# Patient Record
Sex: Male | Born: 1935 | Smoking: Never smoker
Health system: Southern US, Community
[De-identification: ages and names within clinical notes are randomized; demographics above are authoritative.]

## PROBLEM LIST (undated history)

## (undated) DIAGNOSIS — M1990 Unspecified osteoarthritis, unspecified site: Secondary | ICD-10-CM

## (undated) DIAGNOSIS — I714 Abdominal aortic aneurysm, without rupture, unspecified: Secondary | ICD-10-CM

## (undated) DIAGNOSIS — K8010 Calculus of gallbladder with chronic cholecystitis without obstruction: Secondary | ICD-10-CM

## (undated) DIAGNOSIS — E119 Type 2 diabetes mellitus without complications: Secondary | ICD-10-CM

## (undated) DIAGNOSIS — K219 Gastro-esophageal reflux disease without esophagitis: Secondary | ICD-10-CM

## (undated) DIAGNOSIS — E785 Hyperlipidemia, unspecified: Secondary | ICD-10-CM

## (undated) DIAGNOSIS — E079 Disorder of thyroid, unspecified: Secondary | ICD-10-CM

## (undated) DIAGNOSIS — H9110 Presbycusis, unspecified ear: Secondary | ICD-10-CM

## (undated) DIAGNOSIS — J449 Chronic obstructive pulmonary disease, unspecified: Secondary | ICD-10-CM

## (undated) HISTORY — DX: Presbycusis, unspecified ear: H91.10

## (undated) HISTORY — DX: Abdominal aortic aneurysm, without rupture, unspecified: I71.40

## (undated) HISTORY — DX: Calculus of gallbladder with chronic cholecystitis without obstruction: K80.10

## (undated) HISTORY — DX: Hyperlipidemia, unspecified: E78.5

## (undated) HISTORY — DX: Abdominal aortic aneurysm, without rupture: I71.4

## (undated) HISTORY — DX: Disorder of thyroid, unspecified: E07.9

## (undated) HISTORY — DX: Type 2 diabetes mellitus without complications: E11.9

## (undated) HISTORY — DX: Gastro-esophageal reflux disease without esophagitis: K21.9

## (undated) HISTORY — DX: Unspecified osteoarthritis, unspecified site: M19.90

---

## 2013-01-06 DIAGNOSIS — R5381 Other malaise: Secondary | ICD-10-CM | POA: Diagnosis not present

## 2013-06-03 DIAGNOSIS — R5381 Other malaise: Secondary | ICD-10-CM | POA: Diagnosis not present

## 2013-06-03 DIAGNOSIS — R5383 Other fatigue: Secondary | ICD-10-CM | POA: Diagnosis not present

## 2013-08-25 DIAGNOSIS — R5383 Other fatigue: Secondary | ICD-10-CM | POA: Diagnosis not present

## 2013-08-25 DIAGNOSIS — R5381 Other malaise: Secondary | ICD-10-CM | POA: Diagnosis not present

## 2013-10-14 DIAGNOSIS — G933 Postviral fatigue syndrome: Secondary | ICD-10-CM | POA: Diagnosis not present

## 2013-10-16 DIAGNOSIS — Z23 Encounter for immunization: Secondary | ICD-10-CM | POA: Diagnosis not present

## 2013-11-13 DIAGNOSIS — E119 Type 2 diabetes mellitus without complications: Secondary | ICD-10-CM | POA: Diagnosis not present

## 2013-11-13 DIAGNOSIS — I10 Essential (primary) hypertension: Secondary | ICD-10-CM | POA: Diagnosis not present

## 2013-11-13 DIAGNOSIS — E785 Hyperlipidemia, unspecified: Secondary | ICD-10-CM | POA: Diagnosis not present

## 2013-11-13 DIAGNOSIS — R5382 Chronic fatigue, unspecified: Secondary | ICD-10-CM | POA: Diagnosis not present

## 2014-01-22 DIAGNOSIS — L570 Actinic keratosis: Secondary | ICD-10-CM | POA: Diagnosis not present

## 2014-01-22 DIAGNOSIS — L821 Other seborrheic keratosis: Secondary | ICD-10-CM | POA: Diagnosis not present

## 2014-01-22 DIAGNOSIS — Z85828 Personal history of other malignant neoplasm of skin: Secondary | ICD-10-CM | POA: Diagnosis not present

## 2014-06-12 DIAGNOSIS — I714 Abdominal aortic aneurysm, without rupture: Secondary | ICD-10-CM | POA: Diagnosis not present

## 2014-06-12 DIAGNOSIS — R1010 Upper abdominal pain, unspecified: Secondary | ICD-10-CM | POA: Diagnosis not present

## 2014-06-12 DIAGNOSIS — R1084 Generalized abdominal pain: Secondary | ICD-10-CM | POA: Diagnosis not present

## 2014-06-12 DIAGNOSIS — R1900 Intra-abdominal and pelvic swelling, mass and lump, unspecified site: Secondary | ICD-10-CM | POA: Diagnosis not present

## 2014-06-12 DIAGNOSIS — R109 Unspecified abdominal pain: Secondary | ICD-10-CM | POA: Diagnosis not present

## 2014-06-12 DIAGNOSIS — K8020 Calculus of gallbladder without cholecystitis without obstruction: Secondary | ICD-10-CM | POA: Diagnosis not present

## 2014-06-15 DIAGNOSIS — E119 Type 2 diabetes mellitus without complications: Secondary | ICD-10-CM | POA: Diagnosis not present

## 2014-06-15 DIAGNOSIS — I10 Essential (primary) hypertension: Secondary | ICD-10-CM | POA: Diagnosis not present

## 2014-06-15 DIAGNOSIS — K810 Acute cholecystitis: Secondary | ICD-10-CM | POA: Diagnosis not present

## 2014-06-15 DIAGNOSIS — K859 Acute pancreatitis, unspecified: Secondary | ICD-10-CM | POA: Diagnosis not present

## 2014-06-16 DIAGNOSIS — K624 Stenosis of anus and rectum: Secondary | ICD-10-CM | POA: Diagnosis not present

## 2014-06-16 DIAGNOSIS — K8020 Calculus of gallbladder without cholecystitis without obstruction: Secondary | ICD-10-CM | POA: Diagnosis not present

## 2014-06-16 DIAGNOSIS — I719 Aortic aneurysm of unspecified site, without rupture: Secondary | ICD-10-CM | POA: Diagnosis not present

## 2014-06-30 DIAGNOSIS — I714 Abdominal aortic aneurysm, without rupture: Secondary | ICD-10-CM | POA: Diagnosis not present

## 2014-06-30 DIAGNOSIS — K8010 Calculus of gallbladder with chronic cholecystitis without obstruction: Secondary | ICD-10-CM | POA: Diagnosis not present

## 2014-06-30 DIAGNOSIS — R1011 Right upper quadrant pain: Secondary | ICD-10-CM | POA: Diagnosis not present

## 2014-07-01 ENCOUNTER — Encounter: Payer: Self-pay | Admitting: Vascular Surgery

## 2014-07-01 ENCOUNTER — Ambulatory Visit (INDEPENDENT_AMBULATORY_CARE_PROVIDER_SITE_OTHER): Payer: Medicare Other | Admitting: Vascular Surgery

## 2014-07-01 VITALS — BP 138/73 | HR 86 | Ht 66.0 in | Wt 128.0 lb

## 2014-07-01 DIAGNOSIS — I714 Abdominal aortic aneurysm, without rupture, unspecified: Secondary | ICD-10-CM

## 2014-07-01 DIAGNOSIS — Z01810 Encounter for preprocedural cardiovascular examination: Secondary | ICD-10-CM | POA: Diagnosis not present

## 2014-07-01 NOTE — Progress Notes (Signed)
[facility] [place]  Patient name: Christian Dominguez MRN: [id number] DOB: 01/20/1921 Sex: male  REASON FOR CONSULT: abdominal aortic aneurysm  HPI: Christian Dominguez is a 81 y.o. male who was referred with a 6.2 cm abdominal aortic aneurysm. He was referred by Dr. Obie Dredge.   I have reviewed the records that were sent with the patient. The patient underwent an ultrasound on 06/12/2014. This showed a 6.2 cm infrarenal abdominal aortic aneurysm. The patient was being evaluated with cholelithiasis. The patient had been having abdominal pain which prompted the ultrasound to look for gallstones. He is being considered for elective cholecystectomy.  He denies any significant back pain. His abdominal pain has resolved. He had developed some mid abdominal pain approximately 3 weeks ago which lasted briefly unresolved. He denies any family history of aneurysmal disease.   Past Medical History  Diagnosis Date  . Calculus of gallbladder with cholecystitis   . AAA (abdominal aortic aneurysm)   . Arthritis   . Diabetes mellitus without complication   . Esophageal reflux   . Hearing loss of aging   . Hyperlipidemia   . Thyroid disease    Family History  Problem Relation Age of Onset  . Heart disease Father    SOCIAL HISTORY: History  Substance Use Topics  . Smoking status: Never Smoker   . Smokeless tobacco: Not on file  . Alcohol Use: No   No Known Allergies Current Outpatient Prescriptions  Medication Sig Dispense Refill  . albuterol (PROVENTIL HFA;VENTOLIN HFA) 108 (90 BASE) MCG/ACT inhaler Inhale into the lungs every 6 (six) hours as needed for wheezing or shortness of breath.    Marland Kitchen amLODipine (NORVASC) 10 MG tablet Take 10 mg by mouth daily.    Marland Kitchen glipiZIDE (GLUCOTROL) 5 MG tablet Take 5 mg by mouth daily before breakfast.    . levothyroxine (LEVOTHROID) 50 MCG tablet Take 50 mcg by mouth daily before breakfast.    . losartan-hydrochlorothiazide (HYZAAR) 100-12.5 MG per  tablet Take 1 tablet by mouth daily.    . metFORMIN (GLUCOPHAGE) 1000 MG tablet Take 1,000 mg by mouth 2 (two) times daily with a meal.    . omeprazole (PRILOSEC) 40 MG capsule Take 40 mg by mouth daily.    . ondansetron (ZOFRAN) 4 MG tablet Take 4 mg by mouth every 8 (eight) hours as needed for nausea or vomiting.    . pravastatin (PRAVACHOL) 40 MG tablet Take 40 mg by mouth daily.     No current facility-administered medications for this visit.   REVIEW OF SYSTEMS: Arly.Keller ] denotes positive finding; [  ] denotes negative finding  CARDIOVASCULAR:  [ ]  chest pain   [ ]  chest pressure   [ ]  palpitations   [ ]  orthopnea   Arly.Keller ] dyspnea on exertion   [ ]  claudication   [ ]  rest pain   [ ]  DVT   [ ]  phlebitis PULMONARY:   [ ]  productive cough   [ ]  asthma   [ ]  wheezing NEUROLOGIC:   [ ]  weakness  [ ]  paresthesias  [ ]  aphasia  [ ]  amaurosis  [ ]  dizziness HEMATOLOGIC:   [ ]  bleeding problems   [ ]  clotting disorders MUSCULOSKELETAL:  [ ]  joint pain   [ ]  joint swelling [ ]  leg swelling GASTROINTESTINAL: [ ]   blood in stool  [ ]   hematemesis GENITOURINARY:  [ ]   dysuria  [ ]   hematuria PSYCHIATRIC:  [ ]  history of major depression  INTEGUMENTARY:   rashes   ulcers CONSTITUTIONAL:   fever    chills  PHYSICAL EXAM: Filed Vitals:   07/01/14 1054  BP: 138/73  Pulse: 86  Height:  (1.676 m)  Weight: 128 lb (58.06 kg)  SpO2: 93%   GENERAL: The patient is a well-nourished male, in no acute distress. The vital signs are documented above. CARDIOVASCULAR: There is a regular rate and rhythm. I do not detect carotid bruits. He has palpable femoral pulses, palpable popliteal pulses, and palpable dorsalis pedis pulses. He has right lower extremity swelling which is chronic. PULMONARY: There is good air exchange bilaterally without wheezing or rales. ABDOMEN: Soft and non-tender with normal pitched bowel sounds. His aneurysm is palpable and nontender. MUSCULOSKELETAL: There are no major  deformities or cyanosis. NEUROLOGIC: No focal weakness or paresthesias are detected. SKIN: There are no ulcers or rashes noted. PSYCHIATRIC: The patient has a normal affect.  DATA:  I have reviewed his ultrasound which shows that he has a 6.2 cm infrarenal abdominal aortic aneurysm.  MEDICAL ISSUES:  6.2 CM ABDOMINAL AORTIC ANEURYSM: Given the size of the aneurysm the risk of rupture is approximately 15% per year. I have recommended a CT angiogram to evaluate him for possible endovascular aneurysm repair. Given his age certainly this would be ideal if possible. In addition we will arrange for preoperative cardiac evaluation. Pending these results we can consider elective repair of his aneurysm. Of note I spoke with his daughter who had some concerns about surgery given his age and obviously once we have more information we can discuss this further. We've also discussed that if he develops sudden abdominal pain or back pain to be sure that he gets to the emergency room urgently and notified in that he has a history of an aneurysm. He is not a smoker. I have encouraged him to begin taking an aspirin daily. He is on a statin.   Waverly Ferrari [facility] [place] Beeper: [phone]

## 2014-07-08 ENCOUNTER — Encounter: Payer: Self-pay | Admitting: Family Medicine

## 2014-07-09 ENCOUNTER — Ambulatory Visit: Payer: Medicare Other | Admitting: Cardiology

## 2014-07-09 DIAGNOSIS — E119 Type 2 diabetes mellitus without complications: Secondary | ICD-10-CM | POA: Diagnosis not present

## 2014-07-09 DIAGNOSIS — I714 Abdominal aortic aneurysm, without rupture: Secondary | ICD-10-CM | POA: Diagnosis not present

## 2014-07-09 DIAGNOSIS — I10 Essential (primary) hypertension: Secondary | ICD-10-CM | POA: Diagnosis not present

## 2014-07-09 DIAGNOSIS — E785 Hyperlipidemia, unspecified: Secondary | ICD-10-CM | POA: Diagnosis not present

## 2014-07-13 ENCOUNTER — Encounter: Payer: Self-pay | Admitting: Vascular Surgery

## 2014-07-13 ENCOUNTER — Other Ambulatory Visit: Payer: Self-pay | Admitting: Vascular Surgery

## 2014-07-13 DIAGNOSIS — I714 Abdominal aortic aneurysm, without rupture: Secondary | ICD-10-CM | POA: Diagnosis not present

## 2014-07-13 DIAGNOSIS — Z01810 Encounter for preprocedural cardiovascular examination: Secondary | ICD-10-CM | POA: Diagnosis not present

## 2014-07-13 LAB — CREATININE, SERUM: Creat: 2.25 mg/dL — ABNORMAL HIGH (ref 0.50–1.35)

## 2014-07-13 LAB — BUN: BUN: 41 mg/dL — ABNORMAL HIGH (ref 6–23)

## 2014-07-14 DIAGNOSIS — I517 Cardiomegaly: Secondary | ICD-10-CM | POA: Diagnosis not present

## 2014-07-14 DIAGNOSIS — Z01810 Encounter for preprocedural cardiovascular examination: Secondary | ICD-10-CM | POA: Diagnosis not present

## 2014-07-14 DIAGNOSIS — I714 Abdominal aortic aneurysm, without rupture: Secondary | ICD-10-CM | POA: Diagnosis not present

## 2014-07-14 DIAGNOSIS — I083 Combined rheumatic disorders of mitral, aortic and tricuspid valves: Secondary | ICD-10-CM | POA: Diagnosis not present

## 2014-07-14 NOTE — Progress Notes (Unsigned)
Notified by [facility] of elevated Creat. Of 2.25, and BUN of 41.  Per CT Tech, the Radiologist is recommending to either schedule an unenhanced CT scan of abd. And pelvis, or to admit to hospital for IV Hydration, per protocol, and then recheck labs, and poss. Do the CTA abd/ pelvis if IV Hydration improves his CR and BUN.  Discussed with Dr. Edilia Bo.  Recommended to sched. Pt. For admission to [facility]. On 07/16/14 for IV Hydration, and then plan to recheck lab work and reorder the CTA abd/ pelvis if labs improved.  Discussed with granddaughter, Tomma Lightning.  She spoke with the pt. and his daughter, and is requesting to await the test results of the Echocardiogram, and NM Stress Test, to know if he is even cleared for surgery, before proceeding.

## 2014-07-15 ENCOUNTER — Encounter (HOSPITAL_COMMUNITY)
Admission: EM | Disposition: A | Discharge status: Home Health | Payer: Self-pay | Source: Home / Self Care | Attending: Vascular Surgery

## 2014-07-15 ENCOUNTER — Inpatient Hospital Stay: Admission: RE | Admit: 2014-07-15 | Payer: Medicare Other | Source: Ambulatory Visit

## 2014-07-15 ENCOUNTER — Ambulatory Visit: Payer: Medicare Other | Admitting: Vascular Surgery

## 2014-07-15 ENCOUNTER — Emergency Department (HOSPITAL_COMMUNITY): Payer: Medicare Other | Admitting: Anesthesiology

## 2014-07-15 ENCOUNTER — Inpatient Hospital Stay (HOSPITAL_COMMUNITY)
Admission: EM | Admit: 2014-07-15 | Discharge: 2014-07-23 | DRG: 268 | Disposition: A | Discharge status: Home Health | Payer: Medicare Other | Attending: Vascular Surgery | Admitting: Vascular Surgery

## 2014-07-15 ENCOUNTER — Encounter (HOSPITAL_COMMUNITY): Payer: Self-pay | Admitting: Emergency Medicine

## 2014-07-15 ENCOUNTER — Emergency Department (HOSPITAL_COMMUNITY): Payer: Medicare Other

## 2014-07-15 DIAGNOSIS — N179 Acute kidney failure, unspecified: Secondary | ICD-10-CM | POA: Diagnosis not present

## 2014-07-15 DIAGNOSIS — I714 Abdominal aortic aneurysm, without rupture, unspecified: Secondary | ICD-10-CM | POA: Diagnosis present

## 2014-07-15 DIAGNOSIS — M1990 Unspecified osteoarthritis, unspecified site: Secondary | ICD-10-CM | POA: Diagnosis not present

## 2014-07-15 DIAGNOSIS — J9601 Acute respiratory failure with hypoxia: Secondary | ICD-10-CM | POA: Diagnosis not present

## 2014-07-15 DIAGNOSIS — R0602 Shortness of breath: Secondary | ICD-10-CM

## 2014-07-15 DIAGNOSIS — K4090 Unilateral inguinal hernia, without obstruction or gangrene, not specified as recurrent: Secondary | ICD-10-CM | POA: Diagnosis present

## 2014-07-15 DIAGNOSIS — N189 Chronic kidney disease, unspecified: Secondary | ICD-10-CM | POA: Diagnosis present

## 2014-07-15 DIAGNOSIS — Z8679 Personal history of other diseases of the circulatory system: Secondary | ICD-10-CM | POA: Insufficient documentation

## 2014-07-15 DIAGNOSIS — J8410 Pulmonary fibrosis, unspecified: Secondary | ICD-10-CM | POA: Diagnosis present

## 2014-07-15 DIAGNOSIS — Z79899 Other long term (current) drug therapy: Secondary | ICD-10-CM

## 2014-07-15 DIAGNOSIS — K851 Biliary acute pancreatitis: Secondary | ICD-10-CM | POA: Diagnosis present

## 2014-07-15 DIAGNOSIS — K5730 Diverticulosis of large intestine without perforation or abscess without bleeding: Secondary | ICD-10-CM | POA: Diagnosis not present

## 2014-07-15 DIAGNOSIS — I5032 Chronic diastolic (congestive) heart failure: Secondary | ICD-10-CM | POA: Diagnosis not present

## 2014-07-15 DIAGNOSIS — E1122 Type 2 diabetes mellitus with diabetic chronic kidney disease: Secondary | ICD-10-CM | POA: Diagnosis present

## 2014-07-15 DIAGNOSIS — K8020 Calculus of gallbladder without cholecystitis without obstruction: Secondary | ICD-10-CM | POA: Diagnosis present

## 2014-07-15 DIAGNOSIS — I272 Other secondary pulmonary hypertension: Secondary | ICD-10-CM | POA: Diagnosis not present

## 2014-07-15 DIAGNOSIS — Z09 Encounter for follow-up examination after completed treatment for conditions other than malignant neoplasm: Secondary | ICD-10-CM

## 2014-07-15 DIAGNOSIS — I4719 Other supraventricular tachycardia: Secondary | ICD-10-CM | POA: Diagnosis present

## 2014-07-15 DIAGNOSIS — I480 Paroxysmal atrial fibrillation: Secondary | ICD-10-CM | POA: Diagnosis not present

## 2014-07-15 DIAGNOSIS — K219 Gastro-esophageal reflux disease without esophagitis: Secondary | ICD-10-CM | POA: Diagnosis present

## 2014-07-15 DIAGNOSIS — J849 Interstitial pulmonary disease, unspecified: Secondary | ICD-10-CM

## 2014-07-15 DIAGNOSIS — H9190 Unspecified hearing loss, unspecified ear: Secondary | ICD-10-CM | POA: Diagnosis present

## 2014-07-15 DIAGNOSIS — Z9889 Other specified postprocedural states: Secondary | ICD-10-CM | POA: Insufficient documentation

## 2014-07-15 DIAGNOSIS — Z8249 Family history of ischemic heart disease and other diseases of the circulatory system: Secondary | ICD-10-CM

## 2014-07-15 DIAGNOSIS — I471 Supraventricular tachycardia: Secondary | ICD-10-CM | POA: Diagnosis not present

## 2014-07-15 DIAGNOSIS — E785 Hyperlipidemia, unspecified: Secondary | ICD-10-CM | POA: Diagnosis present

## 2014-07-15 DIAGNOSIS — K5750 Diverticulosis of both small and large intestine without perforation or abscess without bleeding: Secondary | ICD-10-CM | POA: Diagnosis present

## 2014-07-15 DIAGNOSIS — Z452 Encounter for adjustment and management of vascular access device: Secondary | ICD-10-CM

## 2014-07-15 DIAGNOSIS — D62 Acute posthemorrhagic anemia: Secondary | ICD-10-CM | POA: Diagnosis not present

## 2014-07-15 DIAGNOSIS — K859 Acute pancreatitis, unspecified: Secondary | ICD-10-CM | POA: Diagnosis not present

## 2014-07-15 DIAGNOSIS — I959 Hypotension, unspecified: Secondary | ICD-10-CM | POA: Diagnosis present

## 2014-07-15 HISTORY — PX: ABDOMINAL AORTIC ENDOVASCULAR STENT GRAFT: SHX5707

## 2014-07-15 LAB — POCT I-STAT 7, (LYTES, BLD GAS, ICA,H+H)
Acid-base deficit: 4 mmol/L — ABNORMAL HIGH (ref 0.0–2.0)
Bicarbonate: 21.8 mEq/L (ref 20.0–24.0)
Calcium, Ion: 1.15 mmol/L (ref 1.13–1.30)
HEMATOCRIT: 31 % — AB (ref 39.0–52.0)
HEMOGLOBIN: 10.5 g/dL — AB (ref 13.0–17.0)
O2 Saturation: 99 %
PCO2 ART: 42.2 mmHg (ref 35.0–45.0)
PH ART: 7.317 — AB (ref 7.350–7.450)
Patient temperature: 36
Potassium: 3.9 mmol/L (ref 3.5–5.1)
SODIUM: 134 mmol/L — AB (ref 135–145)
TCO2: 23 mmol/L (ref 0–100)
pO2, Arterial: 122 mmHg — ABNORMAL HIGH (ref 80.0–100.0)

## 2014-07-15 LAB — COMPREHENSIVE METABOLIC PANEL
ALBUMIN: 3.8 g/dL (ref 3.5–5.0)
ALK PHOS: 95 U/L (ref 38–126)
ALT: 245 U/L — ABNORMAL HIGH (ref 17–63)
ANION GAP: 12 (ref 5–15)
AST: 555 U/L — ABNORMAL HIGH (ref 15–41)
BUN: 40 mg/dL — ABNORMAL HIGH (ref 6–20)
CALCIUM: 9.1 mg/dL (ref 8.9–10.3)
CO2: 24 mmol/L (ref 22–32)
Chloride: 96 mmol/L — ABNORMAL LOW (ref 101–111)
Creatinine, Ser: 3.17 mg/dL — ABNORMAL HIGH (ref 0.61–1.24)
GFR calc Af Amer: 18 mL/min — ABNORMAL LOW (ref >60)
GFR calc non Af Amer: 16 mL/min — ABNORMAL LOW (ref >60)
GLUCOSE: 120 mg/dL — AB (ref 65–99)
POTASSIUM: 4.5 mmol/L (ref 3.5–5.1)
SODIUM: 132 mmol/L — AB (ref 135–145)
TOTAL PROTEIN: 7.3 g/dL (ref 6.5–8.1)
Total Bilirubin: 1.9 mg/dL — ABNORMAL HIGH (ref 0.3–1.2)

## 2014-07-15 LAB — CBC
HEMATOCRIT: 36.5 % — AB (ref 39.0–52.0)
HEMOGLOBIN: 12.2 g/dL — AB (ref 13.0–17.0)
MCH: 30.2 pg (ref 26.0–34.0)
MCHC: 33.4 g/dL (ref 30.0–36.0)
MCV: 90.3 fL (ref 78.0–100.0)
PLATELETS: 322 10*3/uL (ref 150–400)
RBC: 4.04 MIL/uL — AB (ref 4.22–5.81)
RDW: 14.3 % (ref 11.5–15.5)
WBC: 24.8 10*3/uL — AB (ref 4.0–10.5)

## 2014-07-15 LAB — I-STAT CG4 LACTIC ACID, ED: Lactic Acid, Venous: 2.28 mmol/L (ref 0.5–2.0)

## 2014-07-15 LAB — ABO/RH: ABO/RH(D): A POS

## 2014-07-15 LAB — PREPARE RBC (CROSSMATCH)

## 2014-07-15 SURGERY — INSERTION, ENDOVASCULAR STENT GRAFT, AORTA, ABDOMINAL
Anesthesia: General | Site: Abdomen

## 2014-07-15 MED ORDER — VECURONIUM BROMIDE 10 MG IV SOLR
INTRAVENOUS | Status: AC
  Filled 2014-07-15: qty 10

## 2014-07-15 MED ORDER — LIDOCAINE HCL (CARDIAC) 20 MG/ML IV SOLN
INTRAVENOUS | Status: DC | PRN
  Administered 2014-07-15: 50 mg via INTRAVENOUS

## 2014-07-15 MED ORDER — FENTANYL CITRATE (PF) 250 MCG/5ML IJ SOLN
INTRAMUSCULAR | Status: AC
  Filled 2014-07-15: qty 5

## 2014-07-15 MED ORDER — HEPARIN SODIUM (PORCINE) 1000 UNIT/ML IJ SOLN
INTRAMUSCULAR | Status: AC
  Filled 2014-07-15: qty 1

## 2014-07-15 MED ORDER — CEFAZOLIN SODIUM-DEXTROSE 2-3 GM-% IV SOLR
INTRAVENOUS | Status: DC | PRN
Start: 2014-07-15 — End: 2014-07-16
  Administered 2014-07-15: 2 g via INTRAVENOUS

## 2014-07-15 MED ORDER — SODIUM CHLORIDE 0.9 % IV SOLN
1000.0000 mL | Freq: Once | INTRAVENOUS | Status: AC
  Administered 2014-07-15: 1000 mL via INTRAVENOUS

## 2014-07-15 MED ORDER — IODIXANOL 320 MG/ML IV SOLN
INTRAVENOUS | Status: DC | PRN
  Administered 2014-07-15: 55.6 mL via INTRAVENOUS

## 2014-07-15 MED ORDER — LACTATED RINGERS IV SOLN
INTRAVENOUS | Status: DC | PRN
  Administered 2014-07-15 (×2): via INTRAVENOUS

## 2014-07-15 MED ORDER — MIDAZOLAM HCL 2 MG/2ML IJ SOLN
INTRAMUSCULAR | Status: AC
  Filled 2014-07-15: qty 2

## 2014-07-15 MED ORDER — NEOSTIGMINE METHYLSULFATE 10 MG/10ML IV SOLN
INTRAVENOUS | Status: AC
  Filled 2014-07-15: qty 1

## 2014-07-15 MED ORDER — ETOMIDATE 2 MG/ML IV SOLN
INTRAVENOUS | Status: DC | PRN
  Administered 2014-07-15: 10 mg via INTRAVENOUS

## 2014-07-15 MED ORDER — SODIUM CHLORIDE 0.9 % IV SOLN: Freq: Once | INTRAVENOUS | Status: DC

## 2014-07-15 MED ORDER — PROTAMINE SULFATE 10 MG/ML IV SOLN
INTRAVENOUS | Status: DC | PRN
  Administered 2014-07-15: 30 mg via INTRAVENOUS
  Administered 2014-07-15: 20 mg via INTRAVENOUS

## 2014-07-15 MED ORDER — PROTAMINE SULFATE 10 MG/ML IV SOLN
INTRAVENOUS | Status: AC
  Filled 2014-07-15: qty 5

## 2014-07-15 MED ORDER — SUCCINYLCHOLINE CHLORIDE 20 MG/ML IJ SOLN
INTRAMUSCULAR | Status: AC
  Filled 2014-07-15: qty 1

## 2014-07-15 MED ORDER — CEFAZOLIN SODIUM-DEXTROSE 2-3 GM-% IV SOLR
INTRAVENOUS | Status: AC
  Filled 2014-07-15: qty 50

## 2014-07-15 MED ORDER — STERILE WATER FOR INJECTION IJ SOLN
INTRAMUSCULAR | Status: AC
  Filled 2014-07-15: qty 10

## 2014-07-15 MED ORDER — ROCURONIUM BROMIDE 50 MG/5ML IV SOLN
INTRAVENOUS | Status: AC
  Filled 2014-07-15: qty 1

## 2014-07-15 MED ORDER — HEPARIN SODIUM (PORCINE) 1000 UNIT/ML IJ SOLN
INTRAMUSCULAR | Status: DC | PRN
  Administered 2014-07-15: 6000 [IU] via INTRAVENOUS

## 2014-07-15 MED ORDER — EPHEDRINE SULFATE 50 MG/ML IJ SOLN
INTRAMUSCULAR | Status: DC | PRN
Start: 2014-07-15 — End: 2014-07-16
  Administered 2014-07-15 (×2): 5 mg via INTRAVENOUS
  Administered 2014-07-15: 10 mg via INTRAVENOUS
  Administered 2014-07-15: 5 mg via INTRAVENOUS

## 2014-07-15 MED ORDER — VECURONIUM BROMIDE 10 MG IV SOLR
INTRAVENOUS | Status: DC | PRN
  Administered 2014-07-15: 5 mg via INTRAVENOUS

## 2014-07-15 MED ORDER — SODIUM CHLORIDE 0.9 % IR SOLN
Status: DC | PRN
  Administered 2014-07-15: 500 mL

## 2014-07-15 MED ORDER — PROPOFOL 10 MG/ML IV BOLUS
INTRAVENOUS | Status: AC
  Filled 2014-07-15: qty 20

## 2014-07-15 MED ORDER — GLYCOPYRROLATE 0.2 MG/ML IJ SOLN
INTRAMUSCULAR | Status: AC
  Filled 2014-07-15: qty 2

## 2014-07-15 MED ORDER — ETOMIDATE 2 MG/ML IV SOLN
INTRAVENOUS | Status: AC
  Filled 2014-07-15: qty 10

## 2014-07-15 MED ORDER — PHENYLEPHRINE HCL 10 MG/ML IJ SOLN
INTRAMUSCULAR | Status: AC
  Filled 2014-07-15: qty 1

## 2014-07-15 MED ORDER — SODIUM CHLORIDE 0.9 % IV SOLN: 1000.0000 mL | INTRAVENOUS | Status: DC

## 2014-07-15 MED ORDER — ESMOLOL HCL 10 MG/ML IV SOLN
INTRAVENOUS | Status: AC
  Filled 2014-07-15: qty 10

## 2014-07-15 MED ORDER — FENTANYL CITRATE (PF) 100 MCG/2ML IJ SOLN
INTRAMUSCULAR | Status: DC | PRN
  Administered 2014-07-15 (×3): 50 ug via INTRAVENOUS
  Administered 2014-07-15: 100 ug via INTRAVENOUS

## 2014-07-15 MED ORDER — SUCCINYLCHOLINE CHLORIDE 20 MG/ML IJ SOLN
INTRAMUSCULAR | Status: DC | PRN
  Administered 2014-07-15: 80 mg via INTRAVENOUS

## 2014-07-15 MED ORDER — PHENYLEPHRINE HCL 10 MG/ML IJ SOLN
10.0000 mg | INTRAVENOUS | Status: DC | PRN
  Administered 2014-07-15: 25 ug/min via INTRAVENOUS

## 2014-07-15 MED ORDER — PHENYLEPHRINE HCL 10 MG/ML IJ SOLN
INTRAMUSCULAR | Status: DC | PRN
  Administered 2014-07-15: 80 ug via INTRAVENOUS
  Administered 2014-07-15: 40 ug via INTRAVENOUS
  Administered 2014-07-15 (×2): 80 ug via INTRAVENOUS
  Administered 2014-07-15: 120 ug via INTRAVENOUS

## 2014-07-15 MED ORDER — IOHEXOL 350 MG/ML SOLN
100.0000 mL | Freq: Once | INTRAVENOUS | Status: AC | PRN
  Administered 2014-07-15: 100 mL via INTRAVENOUS

## 2014-07-15 SURGICAL SUPPLY — 60 items
BAG SNAP BAND KOVER 36X36 (MISCELLANEOUS) ×2 IMPLANT
CANISTER SUCTION 2500CC (MISCELLANEOUS) ×2 IMPLANT
CATH ANGIO 5F BER2 100CM (CATHETERS) ×2 IMPLANT
CATH BEACON 5.038 65CM KMP-01 (CATHETERS) IMPLANT
CATH OMNI FLUSH .035X70CM (CATHETERS) ×2 IMPLANT
CATH STRAIGHT 5FR 65CM (CATHETERS) ×2 IMPLANT
COVER BACK TABLE 60X90IN (DRAPES) ×2 IMPLANT
COVER DOME SNAP 22 D (MISCELLANEOUS) ×2 IMPLANT
COVER MAYO STAND STRL (DRAPES) ×2 IMPLANT
COVER PROBE W GEL 5X96 (DRAPES) ×2 IMPLANT
DEVICE CLOSURE PERCLS PRGLD 6F (VASCULAR PRODUCTS) ×4 IMPLANT
DEVICE TORQUE KENDALL .025-038 (MISCELLANEOUS) ×2 IMPLANT
DRSG TEGADERM 2-3/8X2-3/4 SM (GAUZE/BANDAGES/DRESSINGS) ×2 IMPLANT
DRYSEAL FLEXSHEATH 12FR 33CM (SHEATH) ×1
DRYSEAL FLEXSHEATH 16FR 33CM (SHEATH) ×1
ELECT REM PT RETURN 9FT ADLT (ELECTROSURGICAL) ×4
ELECTRODE REM PT RTRN 9FT ADLT (ELECTROSURGICAL) ×2 IMPLANT
EXCLUDER TNK 23X14.5MMX12CM (Endovascular Graft) ×1 IMPLANT
EXCLUDER TRUNK 23X14.5MMX12CM (Endovascular Graft) ×2 IMPLANT
GAUZE SPONGE 2X2 8PLY STRL LF (GAUZE/BANDAGES/DRESSINGS) ×1 IMPLANT
GLOVE BIO SURGEON STRL SZ7.5 (GLOVE) ×2 IMPLANT
GOWN STRL REUS W/ TWL LRG LVL3 (GOWN DISPOSABLE) ×3 IMPLANT
GOWN STRL REUS W/TWL LRG LVL3 (GOWN DISPOSABLE) ×3
GRAFT BALLN CATH 65CM (STENTS) ×1 IMPLANT
GUIDEWIRE ANGLED .035X150CM (WIRE) ×2 IMPLANT
KIT BASIN OR (CUSTOM PROCEDURE TRAY) ×2 IMPLANT
KIT HEART LEFT (KITS) IMPLANT
KIT ROOM TURNOVER OR (KITS) ×2 IMPLANT
LEG CONTRALATERAL 16X16X13.5 (Endovascular Graft) ×1 IMPLANT
LEG CONTRALETERAL16X16X11.5 (Endovascular Graft) ×1 IMPLANT
LIQUID BAND (GAUZE/BANDAGES/DRESSINGS) ×2 IMPLANT
NEEDLE PERC 18GX7CM (NEEDLE) ×4 IMPLANT
NS IRRIG 1000ML POUR BTL (IV SOLUTION) ×2 IMPLANT
PACK ENDOVASCULAR (PACKS) ×2 IMPLANT
PAD ARMBOARD 7.5X6 YLW CONV (MISCELLANEOUS) ×4 IMPLANT
PERCLOSE PROGLIDE 6F (VASCULAR PRODUCTS) ×8
SHEATH AVANTI 11CM 8FR (MISCELLANEOUS) ×2 IMPLANT
SHEATH BRITE TIP 8FR 23CM (MISCELLANEOUS) ×2 IMPLANT
SHEATH DRYSEAL FLEX 12FR 33CM (SHEATH) ×1 IMPLANT
SHEATH DRYSEAL FLEX 16FR 33CM (SHEATH) ×1 IMPLANT
SPONGE GAUZE 2X2 STER 10/PKG (GAUZE/BANDAGES/DRESSINGS) ×1
SPONGE SURGIFOAM ABS GEL 100 (HEMOSTASIS) IMPLANT
STAPLER VISISTAT 35W (STAPLE) IMPLANT
STENT GRAFT BALLN CATH 65CM (STENTS) ×1
STENT GRAFT CONTRALAT 16X11.5 (Endovascular Graft) ×1 IMPLANT
STENT GRAFT CONTRALAT 16X13.5 (Endovascular Graft) ×1 IMPLANT
STOPCOCK MORSE 400PSI 3WAY (MISCELLANEOUS) ×4 IMPLANT
SUT PROLENE 5 0 C 1 24 (SUTURE) IMPLANT
SUT VIC AB 2-0 CT1 27 (SUTURE)
SUT VIC AB 2-0 CT1 TAPERPNT 27 (SUTURE) IMPLANT
SUT VIC AB 3-0 SH 27 (SUTURE)
SUT VIC AB 3-0 SH 27X BRD (SUTURE) IMPLANT
SUT VICRYL 4-0 PS2 18IN ABS (SUTURE) ×4 IMPLANT
SYR 30ML LL (SYRINGE) ×2 IMPLANT
SYRINGE 10CC LL (SYRINGE) ×4 IMPLANT
SYRINGE 20CC LL (MISCELLANEOUS) ×4 IMPLANT
TRAY FOLEY W/METER SILVER 16FR (SET/KITS/TRAYS/PACK) ×2 IMPLANT
TUBING HIGH PRESSURE 120CM (CONNECTOR) ×4 IMPLANT
WIRE AMPLATZ SS-J .035X180CM (WIRE) ×4 IMPLANT
WIRE BENTSON .035X145CM (WIRE) ×4 IMPLANT

## 2014-07-15 NOTE — ED Notes (Signed)
Pt transported to CT on monitor with surgeon and RN accompanying.  No issues while out of room.  Pt back from CT,  OR PA in room at this time.

## 2014-07-15 NOTE — H&P (Signed)
[facility] H&P  [id number]  History of Present Illness: This is a 81 y.o. male with known history of 6.2 cm  abdominal aortic aneurysm who presents to the emergency room with complaints of abdominal pain, nausea and vomiting since 1 pm. He is accompanied by his daughter and granddaughter. He had an episode of abdominal pain a few weeks ago that was found to be pancreatitis. He was seen recently by Dr. Edilia Bo on 07/01/14 for his AAA that was found incidentally on an abdominal ultrasound. Dr. Edilia Bo referred the patient for cardiac clearance for future elective repair. He was also recommended to have a CT angiogram for evaluation but did not complete this yet due to renal dysfunction. He did undergo cardiac evaluation recently and the family reports everything "was good." There are no accessible records of this. He does also have gallstones.  He has a past medical history of hyperlipidemia, cholecystitis and diabetes.   Past Medical History  Diagnosis Date  . Calculus of gallbladder with cholecystitis   . AAA (abdominal aortic aneurysm)   . Arthritis   . Diabetes mellitus without complication   . Esophageal reflux   . Hearing loss of aging   . Hyperlipidemia   . Thyroid disease    History reviewed. No pertinent past surgical history.  No Known Allergies  Prior to Admission medications   Medication Sig Start Date End Date Taking? Authorizing Provider  albuterol (PROVENTIL HFA;VENTOLIN HFA) 108 (90 BASE) MCG/ACT inhaler Inhale into the lungs every 6 (six) hours as needed for wheezing or shortness of breath.   Yes Historical Provider, MD  amLODipine (NORVASC) 10 MG tablet Take 10 mg by mouth at bedtime.    Yes Historical Provider, MD  levothyroxine (LEVOTHROID) 50 MCG tablet Take 50 mcg by mouth daily before breakfast.   Yes Historical Provider, MD  losartan-hydrochlorothiazide (HYZAAR) 100-12.5 MG per tablet Take 1 tablet by mouth daily.   Yes Historical Provider, MD    metFORMIN (GLUCOPHAGE) 1000 MG tablet Take 1,000 mg by mouth 2 (two) times daily with a meal.   Yes Historical Provider, MD  omeprazole (PRILOSEC) 40 MG capsule Take 40 mg by mouth daily.   Yes Historical Provider, MD  ondansetron (ZOFRAN) 4 MG tablet Take 4 mg by mouth every 8 (eight) hours as needed for nausea or vomiting.   Yes Historical Provider, MD  pravastatin (PRAVACHOL) 40 MG tablet Take 40 mg by mouth at bedtime.    Yes Historical Provider, MD    History   Social History  . Marital Status: Unknown    Spouse Name: N/A  . Number of Children: N/A  . Years of Education: N/A   Occupational History  . Not on file.   Social History Main Topics  . Smoking status: Never Smoker   . Smokeless tobacco: Not on file  . Alcohol Use: No  . Drug Use: No  . Sexual Activity: Not on file   Other Topics Concern  . Not on file   Social History Narrative    Family History  Problem Relation Age of Onset  . Heart disease Father     ROS: [x]  Positive   [ ]  Negative   [ ]  All sytems reviewed and are negative  General: [ ]  Weight loss, [ ]  Fever, [x ] chills Neurologic: [ ]  Dizziness, [ ]  Blackouts, [ ]  Seizure [ ]  Stroke, [ ]  "Mini stroke", [ ]  Slurred speech, [ ]  Temporary blindness; [ ]  weakness in arms or legs, [ ]   Hoarseness Cardiac:  Chest pain/pressure,  Shortness of breath at rest [x ] Shortness of breath with exertion,  Atrial fibrillation or irregular heartbeat Vascular:  Pain in legs with walking,  Pain in legs at rest,  Pain in legs at night,   Non-healing ulcer,  Blood clot in vein/DVT,   Pulmonary:  Home oxygen,  Productive cough,  Coughing up blood,  Asthma,   Wheezing Musculoskeletal:  [x ] Arthritis,  Low back pain,  Joint pain Hematologic:  Easy Bruising,  Anemia;  Hepatitis Gastrointestinal:  Blood in stool,  Gastroesophageal Reflux/heartburn,  Trouble swallowing Urinary: [x ] chronic Kidney disease,   on HD -  MWF or  TTHS,  Burning with urination,  Difficulty urinating Endocrine: [x ] hx of diabetes,  hx of thyroid disease Skin:  Rashes,  Wounds Psychological:  Anxiety,  Depression   Physical Examination  Filed Vitals:   07/15/14 2055  BP: 135/62  Pulse: 105  Resp: 24   There is no weight on file to calculate BMI.  General:  WDWN male in NAD Gait: Not observed HENT: WNL, normocephalic Pulmonary: normal non-labored breathing  Cardiac: regular  Abdomen: soft, mild epigastric tenderness, no rebound or guarding  Skin: without rashes, without ulcers  Vascular Exam/Pulses: 2+ radial, brachial and femoral pulses bilaterally, 2+ DP right, non palpable left pedal pulses.  Extremities: with ischemic changes Musculoskeletal: no muscle wasting or atrophy  Neurologic: A&O X 3; Appropriate Affect ; SENSATION: normal; MOTOR FUNCTION:  moving all extremities equally. Speech is fluent/normal   CBC No results found for: WBC, RBC, HGB, HCT, PLT, MCV, MCH, MCHC, RDW, LYMPHSABS, MONOABS, EOSABS, BASOSABS  BMET    Component Value Date/Time   BUN 41* 07/13/2014 1036   CREATININE 2.25* 07/13/2014 1036    ASSESSMENT/PLAN: This is a 81 y.o. male with symptomatic 6.7 cm infrarenal  abdominal aortic aneurysm without rupture. Dr. Darrick Penna reviewed CT scan and deemed patient to be a stent graft candidate. His CT also revealed pancreatic inflammation and gallstones. To OR urgently for endovascular repair of symptomatic abdominal aortic aneurysm.   Maris Berger, PA-C [facility] [phone]   Agree with above.  Pt and family informed of risks of operation including but not limited to renal failure vessel injury myocardial events or death.  They wish to proceed with endovascular repair but are not interested in open repair.  Pt also may have pancreatitis but will repair AAA to take this out of the equation and I have discussed this with the  family.  Fabienne Bruns, MD [facility] [place] Office: [phone] Pager: [phone]

## 2014-07-15 NOTE — ED Notes (Signed)
Unable to get temp at his moment 

## 2014-07-15 NOTE — ED Notes (Addendum)
Pt to be transported to OR with RN and Surgical PA.  Family escorted to waiting room with all of pt's belongings.

## 2014-07-15 NOTE — ED Notes (Signed)
C/o generalized abd pain, nausea, and vomiting since 1pm today.  Family reports history of gallstones and 6cm AAA.

## 2014-07-15 NOTE — Anesthesia Preprocedure Evaluation (Addendum)
Anesthesia Evaluation  Patient identified by MRN, date of birth, ID band Patient awake    Reviewed: Allergy & Precautions, NPO status , Patient's Chart, lab work & pertinent test resultsPreop documentation limited or incomplete due to emergent nature of procedure.  Airway Mallampati: II  TM Distance: >3 FB Neck ROM: Full    Dental   Pulmonary neg pulmonary ROS,  breath sounds clear to auscultation        Cardiovascular hypertension, Pt. on medications + Peripheral Vascular Disease Rhythm:Regular Rate:Normal   AAA   Neuro/Psych negative neurological ROS     GI/Hepatic GERD-  ,Elevated LFT's 2/2 gallstones   Endo/Other  diabetes, Type 2, Oral Hypoglycemic Agents  Renal/GU CRF and ARFRenal disease     Musculoskeletal  (+) Arthritis -,   Abdominal   Peds  Hematology   Anesthesia Other Findings   Reproductive/Obstetrics                            Anesthesia Physical Anesthesia Plan  ASA: IV and emergent  Anesthesia Plan: General   Post-op Pain Management:    Induction: Intravenous  Airway Management Planned: Oral ETT  Additional Equipment: Arterial line and CVP  Intra-op Plan:   Post-operative Plan: Possible Post-op intubation/ventilation and Extubation in OR  Informed Consent: I have reviewed the patients History and Physical, chart, labs and discussed the procedure including the risks, benefits and alternatives for the proposed anesthesia with the patient or authorized representative who has indicated his/her understanding and acceptance.     Plan Discussed with: CRNA and Surgeon  Anesthesia Plan Comments:         Anesthesia Quick Evaluation

## 2014-07-15 NOTE — ED Provider Notes (Signed)
CSN: [id number]     Arrival date & time 07/15/14  1813 History   First MD Initiated Contact with Patient 07/15/14 1903     Chief Complaint  Patient presents with  . Abdominal Pain   (Consider location/radiation/quality/duration/timing/severity/associated sxs/prior Treatment) HPI patient is a 81 year old male with a history of cholelithiasis and AAA presenting today 4 abdominal pain today. Patient reports he awoke this morning with epigastric and right upper quadrant abdominal pain which radiated to his back. Denies any tearing nature. Reports crampy feeling exacerbated by eating. Denies any nausea, vomiting, diarrhea, fevers, chills.  Family at bedside report he had a "all bladder attack" similar to this 2 weeks prior. There is abdominal aortic aneurysm was also discussed. Patient with baseline elevated creatinine and waiting CTA at this time for possible surgical fixation and then removal of the gallbladder. Currently patient rates pain as 6 out of 10, radiating across his abdomen, aggravated by palpation, with alleviation with rest.   Past Medical History  Diagnosis Date  . Calculus of gallbladder with cholecystitis   . AAA (abdominal aortic aneurysm)   . Arthritis   . Diabetes mellitus without complication   . Esophageal reflux   . Hearing loss of aging   . Hyperlipidemia   . Thyroid disease    History reviewed. No pertinent past surgical history. Family History  Problem Relation Age of Onset  . Heart disease Father    History  Substance Use Topics  . Smoking status: Never Smoker   . Smokeless tobacco: Not on file  . Alcohol Use: No    Review of Systems  Constitutional: Negative for fever and chills.  HENT: Negative for congestion and sore throat.   Eyes: Negative for pain.  Respiratory: Negative for cough and shortness of breath.   Cardiovascular: Negative for chest pain and palpitations.  Gastrointestinal: Positive for abdominal pain. Negative for nausea, vomiting,  diarrhea and blood in stool.  Endocrine: Negative.   Genitourinary: Negative for flank pain.  Musculoskeletal: Negative for back pain and neck pain.  Skin: Negative for rash.  Allergic/Immunologic: Negative.   Neurological: Negative for dizziness, syncope, light-headedness and numbness.  Psychiatric/Behavioral: Negative for confusion.    Allergies  Review of patient's allergies indicates no known allergies.  Home Medications   Prior to Admission medications   Medication Sig Start Date End Date Taking? Authorizing Provider  albuterol (PROVENTIL HFA;VENTOLIN HFA) 108 (90 BASE) MCG/ACT inhaler Inhale into the lungs every 6 (six) hours as needed for wheezing or shortness of breath.   Yes Historical Provider, MD  amLODipine (NORVASC) 10 MG tablet Take 10 mg by mouth at bedtime.    Yes Historical Provider, MD  levothyroxine (LEVOTHROID) 50 MCG tablet Take 50 mcg by mouth daily before breakfast.   Yes Historical Provider, MD  losartan-hydrochlorothiazide (HYZAAR) 100-12.5 MG per tablet Take 1 tablet by mouth daily.   Yes Historical Provider, MD  metFORMIN (GLUCOPHAGE) 1000 MG tablet Take 1,000 mg by mouth 2 (two) times daily with a meal.   Yes Historical Provider, MD  omeprazole (PRILOSEC) 40 MG capsule Take 40 mg by mouth daily.   Yes Historical Provider, MD  ondansetron (ZOFRAN) 4 MG tablet Take 4 mg by mouth every 8 (eight) hours as needed for nausea or vomiting.   Yes Historical Provider, MD  pravastatin (PRAVACHOL) 40 MG tablet Take 40 mg by mouth at bedtime.    Yes Historical Provider, MD   BP 95/43 mmHg  Pulse 65  Temp(Src) 97.5 F (36.4 C) (Axillary)  Resp 24  Ht 5\' 6"  (1.676 m)  Wt 128 lb (58.06 kg)  BMI 20.67 kg/m2  SpO2 95% Physical Exam  Constitutional: He is oriented to person, place, and time. He appears well-developed and well-nourished.  HENT:  Head: Normocephalic and atraumatic.  Eyes: Conjunctivae and EOM are normal. Pupils are equal, round, and reactive to light.   Neck: Normal range of motion. Neck supple.  Cardiovascular: Regular rhythm, normal heart sounds and intact distal pulses.  Tachycardia present.   Pulmonary/Chest: Effort normal and breath sounds normal. No respiratory distress.  Abdominal: Soft. Bowel sounds are normal. He exhibits pulsatile midline mass. There is tenderness in the epigastric area. There is positive Murphy's sign. There is no rigidity, no rebound, no guarding, no CVA tenderness and no tenderness at McBurney's point.  Musculoskeletal: Normal range of motion.  Neurological: He is alert and oriented to person, place, and time. He has normal reflexes. No cranial nerve deficit.  Skin: Skin is warm and dry.    ED Course  Procedures (including critical care time) Labs Review Labs Reviewed  LIPASE, BLOOD - Abnormal; Notable for the following:    Lipase >3000 (*)    All other components within normal limits  COMPREHENSIVE METABOLIC PANEL - Abnormal; Notable for the following:    Sodium 132 (*)    Chloride 96 (*)    Glucose, Bld 120 (*)    BUN 40 (*)    Creatinine, Ser 3.17 (*)    AST 555 (*)    ALT 245 (*)    Total Bilirubin 1.9 (*)    GFR calc non Af Amer 16 (*)    GFR calc Af Amer 18 (*)    All other components within normal limits  CBC - Abnormal; Notable for the following:    WBC 24.8 (*)    RBC 4.04 (*)    Hemoglobin 12.2 (*)    HCT 36.5 (*)    All other components within normal limits  URINALYSIS, ROUTINE W REFLEX MICROSCOPIC (NOT AT [facility]) - Abnormal; Notable for the following:    APPearance CLOUDY (*)    Specific Gravity, Urine 1.031 (*)    Hgb urine dipstick LARGE (*)    Leukocytes, UA SMALL (*)    All other components within normal limits  GLUCOSE, CAPILLARY - Abnormal; Notable for the following:    Glucose-Capillary 247 (*)    All other components within normal limits  CBC - Abnormal; Notable for the following:    WBC 33.1 (*)    RBC 3.22 (*)    Hemoglobin 9.7 (*)    HCT 28.7 (*)    All other  components within normal limits  BASIC METABOLIC PANEL - Abnormal; Notable for the following:    Sodium 130 (*)    Chloride 99 (*)    Glucose, Bld 259 (*)    BUN 33 (*)    Creatinine, Ser 2.68 (*)    Calcium 7.5 (*)    GFR calc non Af Amer 19 (*)    GFR calc Af Amer 23 (*)    All other components within normal limits  PROTIME-INR - Abnormal; Notable for the following:    Prothrombin Time 15.5 (*)    All other components within normal limits  MAGNESIUM - Abnormal; Notable for the following:    Magnesium 1.2 (*)    All other components within normal limits  BASIC METABOLIC PANEL - Abnormal; Notable for the following:    Glucose, Bld 161 (*)  BUN 30 (*)    Creatinine, Ser 2.38 (*)    Calcium 7.2 (*)    GFR calc non Af Amer 22 (*)    GFR calc Af Amer 26 (*)    All other components within normal limits  CBC - Abnormal; Notable for the following:    WBC 24.9 (*)    RBC 2.95 (*)    Hemoglobin 8.8 (*)    HCT 26.3 (*)    All other components within normal limits  GLUCOSE, CAPILLARY - Abnormal; Notable for the following:    Glucose-Capillary 230 (*)    All other components within normal limits  LIPASE, BLOOD - Abnormal; Notable for the following:    Lipase 1274 (*)    All other components within normal limits  HEPATIC FUNCTION PANEL - Abnormal; Notable for the following:    Total Protein 4.7 (*)    Albumin 2.5 (*)    AST 326 (*)    ALT 241 (*)    All other components within normal limits  GLUCOSE, CAPILLARY - Abnormal; Notable for the following:    Glucose-Capillary 107 (*)    All other components within normal limits  URINE MICROSCOPIC-ADD ON - Abnormal; Notable for the following:    Bacteria, UA FEW (*)    All other components within normal limits  I-STAT CG4 LACTIC ACID, ED - Abnormal; Notable for the following:    Lactic Acid, Venous 2.28 (*)    All other components within normal limits  POCT I-STAT 7, (LYTES, BLD GAS, ICA,H+H) - Abnormal; Notable for the following:     pH, Arterial 7.317 (*)    pO2, Arterial 122.0 (*)    Acid-base deficit 4.0 (*)    Sodium 134 (*)    HCT 31.0 (*)    Hemoglobin 10.5 (*)    All other components within normal limits  MRSA PCR SCREENING  APTT  TSH  BRAIN NATRIURETIC PEPTIDE  TYPE AND SCREEN  ABO/RH  PREPARE RBC (CROSSMATCH)    Imaging Review Dg Chest Port 1 View  07/16/2014   CLINICAL DATA:  Status post abdominal aortic aneurysm is arm repair ; history of diabetes.  EXAM: PORTABLE CHEST - 1 VIEW  COMPARISON:  Portable chest x-ray of July 16, 2014  FINDINGS: There remains elevation of the right hemidiaphragm. The interstitial markings of both lungs remain diffusely increased but may have slightly improved since yesterday's study. The cardiac silhouette remains enlarged. The pulmonary vascularity is less engorged. The right internal jugular Cordis sheath tip projects over the proximal portion of the SVC. There is no pleural effusion or pneumothorax. The observed bony thorax is unremarkable.  IMPRESSION: Slight interval improvement in CHF and bilateral pulmonary interstitial edema. Underlying pulmonary fibrotic changes.   Electronically Signed   By: David  [place] M.D.   On: 07/16/2014 07:58   Dg Chest Port 1 View  07/16/2014   CLINICAL DATA:  Central line placement  EXAM: PORTABLE CHEST - 1 VIEW  COMPARISON:  None.  FINDINGS: Right central venous catheter with tip projecting over the low SVC. No pneumothorax. Normal heart size. Low lung volumes. Diffuse interstitial pattern to both lungs. Correlation with prior CT abdomen and pelvis demonstrates this to represent diffuse parenchymal fibrosis with cystic honeycomb changes. No blunting of costophrenic angles. No pneumothorax.  IMPRESSION: Right central venous catheter appears to be in satisfactory position. Diffuse coarse interstitial infiltration throughout both lungs with low lung volumes consistent with chronic fibrosis.   Electronically Signed   By: Chrissie Noa  Andria Meuse M.D.   On:  07/16/2014 01:28   Dg Abd Portable 1v  07/16/2014   CLINICAL DATA:  Post abdominal aortic aneurysm surgery.  EXAM: PORTABLE ABDOMEN - 1 VIEW  COMPARISON:  CT abdomen and pelvis 07/15/2014  FINDINGS: Aorto bi-iliac stent graft is present. Bowel gas pattern is normal. Coarse infiltrates in the lung bases suggesting fibrosis.  IMPRESSION: Aorto bi-iliac stent graft. Normal bowel gas pattern. Fibrosis in the lung bases.   Electronically Signed   By: Burman Nieves M.D.   On: 07/16/2014 04:07   Ct Angio Abd/pel W/ And/or W/o  07/15/2014   CLINICAL DATA:  81 year old male with generalized abdominal pain, nausea and vomiting. History of gallstone and abdominal aortic aneurysm.  EXAM: CTA ABDOMEN AND PELVIS wITHOUT AND WITH CONTRAST  TECHNIQUE: Multidetector CT imaging of the abdomen and pelvis was performed using the standard protocol during bolus administration of intravenous contrast. Multiplanar reconstructed images and MIPs were obtained and reviewed to evaluate the vascular anatomy.  CONTRAST:  OMNIPAQUE IOHEXOL 350 MG/ML SOLN  COMPARISON:  None.  FINDINGS: There are emphysematous changes with diffuse ground-glass appearance of the visualized lung bases. There is coronary vascular calcification. Top-normal cardiac size.  No intra-abdominal free air no free fluid.  There is focal heterogeneous uterine enhancement in the right lobe of the liver adjacent to the gallbladder. No definite lesion of identified. Subcentimeter scattered hypodense lesions are too small to characterize. There are multiple stones within the gallbladder. Trace pericholecystic fluid may be present. The common bile duct is normal in caliber. There is diffuse inflammatory changes and edema of the pancreas compatible with pancreatitis. No drainable fluid collection/abscess or pseudocyst identified. The spleen demonstrates a lobulated contour otherwise unremarkable. The adrenal glands appear unremarkable. The right kidney appears  unremarkable. There is a focal area of hypoenhancement involving the inferior pole of the left kidney concerning for a focal hypo profusion/infarct. A small accessory renal artery is noted extending from the distal aorta inferior to the aneurysm into the inferior pole of the left kidney. There is apparent hypo enhancement of the origin of this accessory artery (series 401, image 139 and coronal series 46, image 47) which may account for hypoperfusion of this portion of the left kidney. There is no hydronephrosis on either side. There is apparent diffuse thickening of the bladder wall which may be partly related to underdistention. Cystitis is not excluded. Correlation with urinalysis recommended. The prostate gland and seminal vesicles are grossly unremarkable.  Small hiatal hernia. There is apparent thickening of the stomach and duodenum, likely related to inflammatory changes of the pancreas. There is a 4.4 x 2.7 cm duodenal diverticulum. Inflammatory changes surrounding the diverticulum are likely related to pancreatitis and less likely represent diverticulitis of the duodenum. There is extensive sigmoid and colonic diverticulosis without active inflammatory changes. No evidence of bowel obstruction. Normal appendix.  There is aortoiliac atherosclerotic disease. The aorta is tortuous. There is an infrarenal abdominal aortic aneurysm measuring up to 6.9 cm in greatest axial dimension. This aneurysm starts approximately 4.2 cm inferior to the origins of the left renal artery and extends to the level of the bifurcation of the aorta. No definite extravasation of the contrast identified outside of the confines of the lumen of the aneurysm. Mild haziness of the periaortic fat is likely related to inflammatory changes extending from the pancreas. There is focal narrowing of the origin of the SMA with approximately 40- 50% luminal narrowing. There is atherosclerotic calcification of the ostia of  the renal arteries. The  origins of the celiac axis, IMA as well as the origins of the renal arteries remain patent. There is no lymphadenopathy. The visualized IVC appears unremarkable. No portal venous gas identified. The splenic vein is patent.  Fat containing left inguinal hernia without evidence of inflammation or incarceration. Degenerative changes of the spine. No acute fracture.  Review of the MIP images confirms the above findings.  IMPRESSION: Pancreatitis possibly related to gallstone. No definite stone identified in the central CBD.  Infrarenal abdominal aortic aneurysm. No evidence of extravasation of contrast identified.  Cholelithiasis.  Focal hypoenhancement of the inferior aspect of the left kidney concerning for infarct. A small accessory artery appears to extend from the inferior aorta to the lower pole of the left kidney.  The duodenal diverticulum.  Extensive colonic diverticulosis.  Critical Value/emergent results were called by telephone at the time of interpretation on 07/15/2014 at 8:59 pm to Dr. Derinda Sis , who verbally acknowledged these results.   Electronically Signed   By: Elgie Collard M.D.   On: 07/15/2014 21:50     EKG Interpretation   Date/Time:  Wednesday July 15 2014 20:09:28 EDT Ventricular Rate:  126 PR Interval:    QRS Duration: 96 QT Interval:  341 QTC Calculation: 494 R Axis:   -110 Text Interpretation:  Sinus tachycardia First degree A-V block Right  superior axis Low voltage, precordial leads Repolarization abnormality,  prob rate related Borderline prolonged QT interval No old tracing to  compare Confirmed by [facility]  MD, DAVID ([id number]) on 07/15/2014 8:27:13 PM      EMERGENCY DEPARTMENT [place] ABD/AORTA EXAM Study: Limited Ultrasound of the Abdominal Aorta.  INDICATIONS:Hypotension, Tachycardia, Pulsatile abdominal mass, Abdominal pain, Back pain and Age>55 Indication: Multiple views of the abdominal aorta are obtained from the diaphragmatic hiatus to the aortic bifurcation  in transverse and sagittal planes with a multi- Frequency probe.  PERFORMED BY: Myself  IMAGES ARCHIVED?: Yes  FINDINGS: Maximum aortic dimensions are 5.97 cm  LIMITATIONS:  Bowel gas  INTERPRETATION:  Abdominal aortic aneurysm present and Abdominal free fluid absent  COMMENT:  Known AAA with no interval increase in size.    MDM   Final diagnoses:  Abdominal aortic aneurysm (AAA) >39 mm diameter   Patient is a 81 year old male with a history of cholelithiasis and AAA presenting today 4 abdominal pain today.  On initial evaluation patient in no acute distress but hypotensive. Initial pulse written as 84 but on EKG actually in the 120s. Tachycardic on exam. Pulsatile mass present in the abdomen. Patient subsequently given bolus in the ED with resolution of the hypotension and decrease of the tachycardia. Bedside ultrasound performed showing 6 cm abdominal aortic aneurysm. This was similar in size to what family members at bedside report 2 weeks prior. Subsequently CTA performed showing no acute dissection or extravasation of dye. Patient confirmed to have cholelithiasis and possible pancreatitis.  Lipase elevated greater than 3000. Dr. Darrick Penna with vascular surgery evaluated patient at bedside. With abdominal pain thinking current known AAA he decided to take the patient to the operating room for correction and then further management of pancreatitis and cholelithiasis to be terminated determined afterwards.  If performed, labs, EKGs, and imaging were reviewed/interpreted by myself and my attending and incorporated into medical decision making.  Discussed pertinent finding with patient or caregiver prior to admission with no further questions.  Pt care supervised by my attending Dr. Lenox Ahr, MD PGY-2  Emergency Medicine  Tery Sanfilippo, MD 07/16/14 1222  Dione Booze, MD 07/17/14 [phone]

## 2014-07-15 NOTE — ED Notes (Signed)
MDs at bedside

## 2014-07-15 NOTE — ED Notes (Signed)
Pt's family sts pt O2 hovers in high 80's/low 90's, pt not placed on O2 at this time

## 2014-07-15 NOTE — Anesthesia Procedure Notes (Signed)
Procedure Name: Intubation Date/Time: 07/15/2014 10:12 PM Performed by: Onisha Cedeno S Pre-anesthesia Checklist: Patient identified, Emergency Drugs available, Suction available, Patient being monitored and Timeout performed Patient Re-evaluated:Patient Re-evaluated prior to inductionOxygen Delivery Method: Circle system utilized Preoxygenation: Pre-oxygenation with 100% oxygen Intubation Type: IV induction Ventilation: Mask ventilation without difficulty Laryngoscope Size: Mac and 4 Grade View: Grade I Tube type: Subglottic suction tube Tube size: 7.5 mm Number of attempts: 1 Airway Equipment and Method: Stylet Placement Confirmation: ETT inserted through vocal cords under direct vision,  positive ETCO2 and breath sounds checked- equal and bilateral Secured at: 22 cm Tube secured with: Tape Dental Injury: Teeth and Oropharynx as per pre-operative assessment

## 2014-07-16 ENCOUNTER — Encounter (HOSPITAL_COMMUNITY): Payer: Self-pay | Admitting: Vascular Surgery

## 2014-07-16 ENCOUNTER — Inpatient Hospital Stay (HOSPITAL_COMMUNITY): Payer: Medicare Other

## 2014-07-16 ENCOUNTER — Ambulatory Visit: Payer: Medicare Other | Admitting: Cardiology

## 2014-07-16 ENCOUNTER — Encounter: Payer: Self-pay | Admitting: Cardiology

## 2014-07-16 ENCOUNTER — Emergency Department (HOSPITAL_COMMUNITY): Payer: Medicare Other

## 2014-07-16 DIAGNOSIS — I714 Abdominal aortic aneurysm, without rupture, unspecified: Secondary | ICD-10-CM | POA: Diagnosis present

## 2014-07-16 DIAGNOSIS — R932 Abnormal findings on diagnostic imaging of liver and biliary tract: Secondary | ICD-10-CM | POA: Diagnosis not present

## 2014-07-16 DIAGNOSIS — K8010 Calculus of gallbladder with chronic cholecystitis without obstruction: Secondary | ICD-10-CM | POA: Diagnosis not present

## 2014-07-16 DIAGNOSIS — J8410 Pulmonary fibrosis, unspecified: Secondary | ICD-10-CM | POA: Diagnosis not present

## 2014-07-16 DIAGNOSIS — I471 Supraventricular tachycardia: Secondary | ICD-10-CM | POA: Diagnosis not present

## 2014-07-16 DIAGNOSIS — Z8249 Family history of ischemic heart disease and other diseases of the circulatory system: Secondary | ICD-10-CM | POA: Diagnosis not present

## 2014-07-16 DIAGNOSIS — I509 Heart failure, unspecified: Secondary | ICD-10-CM | POA: Diagnosis not present

## 2014-07-16 DIAGNOSIS — I272 Other secondary pulmonary hypertension: Secondary | ICD-10-CM | POA: Diagnosis present

## 2014-07-16 DIAGNOSIS — H9190 Unspecified hearing loss, unspecified ear: Secondary | ICD-10-CM | POA: Diagnosis present

## 2014-07-16 DIAGNOSIS — N183 Chronic kidney disease, stage 3 (moderate): Secondary | ICD-10-CM | POA: Diagnosis not present

## 2014-07-16 DIAGNOSIS — E1122 Type 2 diabetes mellitus with diabetic chronic kidney disease: Secondary | ICD-10-CM | POA: Diagnosis present

## 2014-07-16 DIAGNOSIS — J84112 Idiopathic pulmonary fibrosis: Secondary | ICD-10-CM | POA: Diagnosis not present

## 2014-07-16 DIAGNOSIS — E785 Hyperlipidemia, unspecified: Secondary | ICD-10-CM | POA: Diagnosis present

## 2014-07-16 DIAGNOSIS — M1990 Unspecified osteoarthritis, unspecified site: Secondary | ICD-10-CM | POA: Diagnosis present

## 2014-07-16 DIAGNOSIS — Z9889 Other specified postprocedural states: Secondary | ICD-10-CM | POA: Diagnosis not present

## 2014-07-16 DIAGNOSIS — I959 Hypotension, unspecified: Secondary | ICD-10-CM | POA: Diagnosis present

## 2014-07-16 DIAGNOSIS — Z452 Encounter for adjustment and management of vascular access device: Secondary | ICD-10-CM | POA: Diagnosis not present

## 2014-07-16 DIAGNOSIS — J9601 Acute respiratory failure with hypoxia: Secondary | ICD-10-CM | POA: Diagnosis not present

## 2014-07-16 DIAGNOSIS — K851 Biliary acute pancreatitis: Secondary | ICD-10-CM | POA: Diagnosis not present

## 2014-07-16 DIAGNOSIS — K5750 Diverticulosis of both small and large intestine without perforation or abscess without bleeding: Secondary | ICD-10-CM | POA: Diagnosis present

## 2014-07-16 DIAGNOSIS — J810 Acute pulmonary edema: Secondary | ICD-10-CM | POA: Diagnosis not present

## 2014-07-16 DIAGNOSIS — I270 Primary pulmonary hypertension: Secondary | ICD-10-CM | POA: Diagnosis not present

## 2014-07-16 DIAGNOSIS — Z9049 Acquired absence of other specified parts of digestive tract: Secondary | ICD-10-CM | POA: Diagnosis not present

## 2014-07-16 DIAGNOSIS — K8020 Calculus of gallbladder without cholecystitis without obstruction: Secondary | ICD-10-CM | POA: Diagnosis not present

## 2014-07-16 DIAGNOSIS — I480 Paroxysmal atrial fibrillation: Secondary | ICD-10-CM | POA: Diagnosis not present

## 2014-07-16 DIAGNOSIS — Z8679 Personal history of other diseases of the circulatory system: Secondary | ICD-10-CM | POA: Diagnosis not present

## 2014-07-16 DIAGNOSIS — I5032 Chronic diastolic (congestive) heart failure: Secondary | ICD-10-CM | POA: Diagnosis not present

## 2014-07-16 DIAGNOSIS — I501 Left ventricular failure: Secondary | ICD-10-CM | POA: Diagnosis not present

## 2014-07-16 DIAGNOSIS — N189 Chronic kidney disease, unspecified: Secondary | ICD-10-CM | POA: Diagnosis present

## 2014-07-16 DIAGNOSIS — R0602 Shortness of breath: Secondary | ICD-10-CM | POA: Diagnosis not present

## 2014-07-16 DIAGNOSIS — K219 Gastro-esophageal reflux disease without esophagitis: Secondary | ICD-10-CM | POA: Diagnosis present

## 2014-07-16 DIAGNOSIS — Z79899 Other long term (current) drug therapy: Secondary | ICD-10-CM | POA: Diagnosis not present

## 2014-07-16 DIAGNOSIS — D62 Acute posthemorrhagic anemia: Secondary | ICD-10-CM | POA: Diagnosis not present

## 2014-07-16 DIAGNOSIS — I700 Atherosclerosis of aorta: Secondary | ICD-10-CM | POA: Diagnosis not present

## 2014-07-16 DIAGNOSIS — N179 Acute kidney failure, unspecified: Secondary | ICD-10-CM | POA: Diagnosis not present

## 2014-07-16 DIAGNOSIS — K4090 Unilateral inguinal hernia, without obstruction or gangrene, not specified as recurrent: Secondary | ICD-10-CM | POA: Diagnosis present

## 2014-07-16 LAB — URINE MICROSCOPIC-ADD ON

## 2014-07-16 LAB — BASIC METABOLIC PANEL
ANION GAP: 8 (ref 5–15)
ANION GAP: 8 (ref 5–15)
BUN: 33 mg/dL — ABNORMAL HIGH (ref 6–20)
BUN: 30 mg/dL — ABNORMAL HIGH (ref 6–20)
CALCIUM: 7.5 mg/dL — AB (ref 8.9–10.3)
CHLORIDE: 99 mmol/L — AB (ref 101–111)
CO2: 23 mmol/L (ref 22–32)
CO2: 24 mmol/L (ref 22–32)
Calcium: 7.2 mg/dL — ABNORMAL LOW (ref 8.9–10.3)
Chloride: 103 mmol/L (ref 101–111)
Creatinine, Ser: 2.68 mg/dL — ABNORMAL HIGH (ref 0.61–1.24)
Creatinine, Ser: 2.38 mg/dL — ABNORMAL HIGH (ref 0.61–1.24)
GFR calc Af Amer: 23 mL/min — ABNORMAL LOW (ref >60)
GFR calc Af Amer: 26 mL/min — ABNORMAL LOW (ref >60)
GFR calc non Af Amer: 19 mL/min — ABNORMAL LOW (ref >60)
GFR calc non Af Amer: 22 mL/min — ABNORMAL LOW (ref >60)
Glucose, Bld: 259 mg/dL — ABNORMAL HIGH (ref 65–99)
Glucose, Bld: 161 mg/dL — ABNORMAL HIGH (ref 65–99)
Potassium: 4.1 mmol/L (ref 3.5–5.1)
Potassium: 4.3 mmol/L (ref 3.5–5.1)
Sodium: 130 mmol/L — ABNORMAL LOW (ref 135–145)
Sodium: 135 mmol/L (ref 135–145)

## 2014-07-16 LAB — URINALYSIS, ROUTINE W REFLEX MICROSCOPIC
Bilirubin Urine: NEGATIVE
GLUCOSE, UA: NEGATIVE mg/dL
Ketones, ur: NEGATIVE mg/dL
NITRITE: NEGATIVE
Protein, ur: NEGATIVE mg/dL
SPECIFIC GRAVITY, URINE: 1.031 — AB (ref 1.005–1.030)
Urobilinogen, UA: 1 mg/dL (ref 0.0–1.0)
pH: 5 (ref 5.0–8.0)

## 2014-07-16 LAB — CBC
HCT: 28.7 % — ABNORMAL LOW (ref 39.0–52.0)
HCT: 26.3 % — ABNORMAL LOW (ref 39.0–52.0)
HEMOGLOBIN: 8.8 g/dL — AB (ref 13.0–17.0)
Hemoglobin: 9.7 g/dL — ABNORMAL LOW (ref 13.0–17.0)
MCH: 30.1 pg (ref 26.0–34.0)
MCH: 29.8 pg (ref 26.0–34.0)
MCHC: 33.8 g/dL (ref 30.0–36.0)
MCHC: 33.5 g/dL (ref 30.0–36.0)
MCV: 89.1 fL (ref 78.0–100.0)
MCV: 89.2 fL (ref 78.0–100.0)
Platelets: 242 10*3/uL (ref 150–400)
Platelets: 229 10*3/uL (ref 150–400)
RBC: 3.22 MIL/uL — ABNORMAL LOW (ref 4.22–5.81)
RBC: 2.95 MIL/uL — ABNORMAL LOW (ref 4.22–5.81)
RDW: 14.2 % (ref 11.5–15.5)
RDW: 14.5 % (ref 11.5–15.5)
WBC: 33.1 10*3/uL — ABNORMAL HIGH (ref 4.0–10.5)
WBC: 24.9 10*3/uL — ABNORMAL HIGH (ref 4.0–10.5)

## 2014-07-16 LAB — TSH: TSH: 6.468 u[IU]/mL — ABNORMAL HIGH (ref 0.350–4.500)

## 2014-07-16 LAB — GLUCOSE, CAPILLARY
GLUCOSE-CAPILLARY: 230 mg/dL — AB (ref 65–99)
GLUCOSE-CAPILLARY: 247 mg/dL — AB (ref 65–99)
GLUCOSE-CAPILLARY: 75 mg/dL (ref 65–99)
Glucose-Capillary: 107 mg/dL — ABNORMAL HIGH (ref 65–99)
Glucose-Capillary: 94 mg/dL (ref 65–99)

## 2014-07-16 LAB — HEPATIC FUNCTION PANEL
ALBUMIN: 2.5 g/dL — AB (ref 3.5–5.0)
ALK PHOS: 88 U/L (ref 38–126)
ALT: 241 U/L — ABNORMAL HIGH (ref 17–63)
AST: 326 U/L — ABNORMAL HIGH (ref 15–41)
BILIRUBIN INDIRECT: 0.4 mg/dL (ref 0.3–0.9)
BILIRUBIN TOTAL: 0.8 mg/dL (ref 0.3–1.2)
Bilirubin, Direct: 0.4 mg/dL (ref 0.1–0.5)
TOTAL PROTEIN: 4.7 g/dL — AB (ref 6.5–8.1)

## 2014-07-16 LAB — PROTIME-INR
INR: 1.21 (ref 0.00–1.49)
Prothrombin Time: 15.5 seconds — ABNORMAL HIGH (ref 11.6–15.2)

## 2014-07-16 LAB — BRAIN NATRIURETIC PEPTIDE: B Natriuretic Peptide: 383.9 pg/mL — ABNORMAL HIGH (ref 0.0–100.0)

## 2014-07-16 LAB — MAGNESIUM: Magnesium: 1.2 mg/dL — ABNORMAL LOW (ref 1.7–2.4)

## 2014-07-16 LAB — LIPASE, BLOOD
Lipase: >3000 U/L — ABNORMAL HIGH (ref 22–51)
Lipase: 1274 U/L — ABNORMAL HIGH (ref 22–51)

## 2014-07-16 LAB — MRSA PCR SCREENING: MRSA by PCR: NEGATIVE

## 2014-07-16 LAB — APTT: APTT: 30 s (ref 24–37)

## 2014-07-16 MED ORDER — POTASSIUM CHLORIDE CRYS ER 20 MEQ PO TBCR: 20.0000 meq | EXTENDED_RELEASE_TABLET | Freq: Every day | ORAL | Status: DC | PRN

## 2014-07-16 MED ORDER — LABETALOL HCL 5 MG/ML IV SOLN
10.0000 mg | INTRAVENOUS | Status: DC | PRN
  Filled 2014-07-16: qty 4

## 2014-07-16 MED ORDER — GLYCOPYRROLATE 0.2 MG/ML IJ SOLN
INTRAMUSCULAR | Status: DC | PRN
  Administered 2014-07-16: 0.4 mg via INTRAVENOUS

## 2014-07-16 MED ORDER — ALBUTEROL SULFATE (2.5 MG/3ML) 0.083% IN NEBU: 2.5000 mg | INHALATION_SOLUTION | Freq: Four times a day (QID) | RESPIRATORY_TRACT | Status: DC | PRN

## 2014-07-16 MED ORDER — LOSARTAN POTASSIUM-HCTZ 100-12.5 MG PO TABS: 1.0000 | ORAL_TABLET | Freq: Every day | ORAL | Status: DC

## 2014-07-16 MED ORDER — OXYCODONE HCL 5 MG PO TABS: 5.0000 mg | ORAL_TABLET | ORAL | Status: DC | PRN

## 2014-07-16 MED ORDER — MORPHINE SULFATE 2 MG/ML IJ SOLN
1.0000 mg | INTRAMUSCULAR | Status: DC | PRN
  Administered 2014-07-17 – 2014-07-20 (×3): 2 mg via INTRAVENOUS
  Filled 2014-07-16: qty 1

## 2014-07-16 MED ORDER — ACETAMINOPHEN 325 MG PO TABS: 325.0000 mg | ORAL_TABLET | ORAL | Status: DC | PRN

## 2014-07-16 MED ORDER — ONDANSETRON HCL 4 MG/2ML IJ SOLN
4.0000 mg | Freq: Four times a day (QID) | INTRAMUSCULAR | Status: DC | PRN
  Administered 2014-07-20: 4 mg via INTRAVENOUS
  Filled 2014-07-16: qty 2

## 2014-07-16 MED ORDER — ONDANSETRON HCL 4 MG/2ML IJ SOLN
4.0000 mg | Freq: Once | INTRAMUSCULAR | Status: AC
  Administered 2014-07-16: 4 mg via INTRAVENOUS

## 2014-07-16 MED ORDER — AMLODIPINE BESYLATE 10 MG PO TABS: 10.0000 mg | ORAL_TABLET | Freq: Every day | ORAL | Status: DC

## 2014-07-16 MED ORDER — MAGNESIUM SULFATE 2 GM/50ML IV SOLN
2.0000 g | Freq: Every day | INTRAVENOUS | Status: DC | PRN
  Filled 2014-07-16: qty 50

## 2014-07-16 MED ORDER — ACETAMINOPHEN 650 MG RE SUPP: 325.0000 mg | RECTAL | Status: DC | PRN

## 2014-07-16 MED ORDER — PRAVASTATIN SODIUM 40 MG PO TABS
40.0000 mg | ORAL_TABLET | Freq: Every day | ORAL | Status: DC
  Administered 2014-07-16 – 2014-07-22 (×7): 40 mg via ORAL
  Filled 2014-07-16 (×10): qty 1

## 2014-07-16 MED ORDER — PANTOPRAZOLE SODIUM 40 MG PO TBEC
40.0000 mg | DELAYED_RELEASE_TABLET | Freq: Every day | ORAL | Status: DC
  Administered 2014-07-16 – 2014-07-23 (×7): 40 mg via ORAL
  Filled 2014-07-16 (×8): qty 1

## 2014-07-16 MED ORDER — ALUM & MAG HYDROXIDE-SIMETH 200-200-20 MG/5ML PO SUSP: 15.0000 mL | ORAL | Status: DC | PRN

## 2014-07-16 MED ORDER — LOSARTAN POTASSIUM 50 MG PO TABS
100.0000 mg | ORAL_TABLET | Freq: Every day | ORAL | Status: DC
  Administered 2014-07-16: 50 mg via ORAL
  Filled 2014-07-16: qty 2

## 2014-07-16 MED ORDER — INSULIN ASPART 100 UNIT/ML ~~LOC~~ SOLN
0.0000 [IU] | Freq: Three times a day (TID) | SUBCUTANEOUS | Status: DC
  Administered 2014-07-18: 2 [IU] via SUBCUTANEOUS
  Administered 2014-07-18: 1 [IU] via SUBCUTANEOUS
  Administered 2014-07-19 (×2): 2 [IU] via SUBCUTANEOUS
  Administered 2014-07-19 – 2014-07-21 (×2): 1 [IU] via SUBCUTANEOUS
  Administered 2014-07-21: 2 [IU] via SUBCUTANEOUS
  Administered 2014-07-21: 1 [IU] via SUBCUTANEOUS
  Administered 2014-07-22: 2 [IU] via SUBCUTANEOUS
  Administered 2014-07-22 – 2014-07-23 (×3): 1 [IU] via SUBCUTANEOUS
  Administered 2014-07-23: 2 [IU] via SUBCUTANEOUS

## 2014-07-16 MED ORDER — NEOSTIGMINE METHYLSULFATE 10 MG/10ML IV SOLN
INTRAVENOUS | Status: DC | PRN
  Administered 2014-07-16: 4 mg via INTRAVENOUS

## 2014-07-16 MED ORDER — FENTANYL CITRATE (PF) 100 MCG/2ML IJ SOLN: 25.0000 ug | INTRAMUSCULAR | Status: DC | PRN

## 2014-07-16 MED ORDER — PHENOL 1.4 % MT LIQD: 1.0000 | OROMUCOSAL | Status: DC | PRN

## 2014-07-16 MED ORDER — SODIUM CHLORIDE 0.45 % IV SOLN
INTRAVENOUS | Status: DC
  Administered 2014-07-16: 02:00:00 via INTRAVENOUS

## 2014-07-16 MED ORDER — HYDRALAZINE HCL 20 MG/ML IJ SOLN: 5.0000 mg | INTRAMUSCULAR | Status: DC | PRN

## 2014-07-16 MED ORDER — ONDANSETRON HCL 4 MG PO TABS: 4.0000 mg | ORAL_TABLET | Freq: Three times a day (TID) | ORAL | Status: DC | PRN

## 2014-07-16 MED ORDER — CEFUROXIME SODIUM 1.5 G IJ SOLR
1.5000 g | Freq: Two times a day (BID) | INTRAMUSCULAR | Status: AC
  Administered 2014-07-16 (×2): 1.5 g via INTRAVENOUS
  Filled 2014-07-16 (×2): qty 1.5

## 2014-07-16 MED ORDER — BISACODYL 10 MG RE SUPP: 10.0000 mg | Freq: Every day | RECTAL | Status: DC | PRN

## 2014-07-16 MED ORDER — FUROSEMIDE 10 MG/ML IJ SOLN
20.0000 mg | Freq: Two times a day (BID) | INTRAMUSCULAR | Status: DC
Start: 2014-07-16 — End: 2014-07-16

## 2014-07-16 MED ORDER — LEVOTHYROXINE SODIUM 50 MCG PO TABS
50.0000 ug | ORAL_TABLET | Freq: Every day | ORAL | Status: DC
  Administered 2014-07-16 – 2014-07-23 (×8): 50 ug via ORAL
  Filled 2014-07-16 (×9): qty 1

## 2014-07-16 MED ORDER — METOPROLOL TARTRATE 1 MG/ML IV SOLN
2.0000 mg | INTRAVENOUS | Status: DC | PRN
  Administered 2014-07-17: 5 mg via INTRAVENOUS
  Filled 2014-07-16: qty 5

## 2014-07-16 MED ORDER — GUAIFENESIN-DM 100-10 MG/5ML PO SYRP: 15.0000 mL | ORAL_SOLUTION | ORAL | Status: DC | PRN

## 2014-07-16 MED ORDER — SODIUM CHLORIDE 0.9 % IV SOLN
500.0000 mL | Freq: Once | INTRAVENOUS | Status: AC | PRN
  Administered 2014-07-16: 500 mL via INTRAVENOUS

## 2014-07-16 MED ORDER — ENOXAPARIN SODIUM 30 MG/0.3ML ~~LOC~~ SOLN
30.0000 mg | SUBCUTANEOUS | Status: DC
  Administered 2014-07-17 – 2014-07-23 (×6): 30 mg via SUBCUTANEOUS
  Filled 2014-07-16 (×7): qty 0.3

## 2014-07-16 MED ORDER — ONDANSETRON HCL 4 MG/2ML IJ SOLN
INTRAMUSCULAR | Status: AC
  Filled 2014-07-16: qty 2

## 2014-07-16 MED ORDER — DOCUSATE SODIUM 100 MG PO CAPS
100.0000 mg | ORAL_CAPSULE | Freq: Every day | ORAL | Status: DC
  Administered 2014-07-17 – 2014-07-23 (×6): 100 mg via ORAL
  Filled 2014-07-16 (×8): qty 1

## 2014-07-16 MED ORDER — ONDANSETRON HCL 4 MG/2ML IJ SOLN
INTRAMUSCULAR | Status: DC | PRN
  Administered 2014-07-15: 4 mg via INTRAVENOUS

## 2014-07-16 MED ORDER — HYDROCHLOROTHIAZIDE 12.5 MG PO CAPS
12.5000 mg | ORAL_CAPSULE | Freq: Every day | ORAL | Status: DC
  Administered 2014-07-16: 12.5 mg via ORAL
  Filled 2014-07-16: qty 1

## 2014-07-16 MED ORDER — DILTIAZEM HCL 30 MG PO TABS
30.0000 mg | ORAL_TABLET | Freq: Four times a day (QID) | ORAL | Status: DC
  Administered 2014-07-16 – 2014-07-23 (×25): 30 mg via ORAL
  Filled 2014-07-16 (×35): qty 1

## 2014-07-16 NOTE — Consult Note (Addendum)
Admit date: 07/15/2014 Referring Physician  Dr. Darrick Penna Primary Cardiologist  None Reason for Consultation  AFib with RVR  HPI: This is a 81 y.o. male with known history of 6.2 cm abdominal aortic aneurysm who presented to the emergency room with complaints of abdominal pain, nausea and vomiting. He had an episode of abdominal pain a few weeks ago that was found to be pancreatitis. He was seen recently by Dr. Edilia Bo on 07/01/14 for his AAA that was found incidentally on an abdominal ultrasound. He underwent stent graft for repair of Abdominal aortic aneurysm.  He is now POD #1 and developed SVT that was felt to be afib with RVR.  Last night he was nauseated with dry heaves and surgery consult is pending for possible gallstone pancreatitis.  Cardiology is now asked to consult for evaluation of arrhythmia.  The patient denies any chest pain but complains of increased SOB this am and is now on a face mask.  He denies any palpitations.  He was seen in [place] for cardiac surgical clearance prior to his surgery.   PMH:   Past Medical History  Diagnosis Date  . Calculus of gallbladder with cholecystitis   . AAA (abdominal aortic aneurysm)   . Arthritis   . Diabetes mellitus without complication   . Esophageal reflux   . Hearing loss of aging   . Hyperlipidemia   . Thyroid disease      PSH:  History reviewed. No pertinent past surgical history.  Allergies:  Review of patient's allergies indicates no known allergies. Prior to Admit Meds:   Prescriptions prior to admission  Medication Sig Dispense Refill Last Dose  . albuterol (PROVENTIL HFA;VENTOLIN HFA) 108 (90 BASE) MCG/ACT inhaler Inhale into the lungs every 6 (six) hours as needed for wheezing or shortness of breath.   07/14/2014 at Unknown time  . amLODipine (NORVASC) 10 MG tablet Take 10 mg by mouth at bedtime.    07/14/2014 at Unknown time  . levothyroxine (LEVOTHROID) 50 MCG tablet Take 50 mcg by mouth daily before breakfast.   07/15/2014  at Unknown time  . losartan-hydrochlorothiazide (HYZAAR) 100-12.5 MG per tablet Take 1 tablet by mouth daily.   07/15/2014 at Unknown time  . metFORMIN (GLUCOPHAGE) 1000 MG tablet Take 1,000 mg by mouth 2 (two) times daily with a meal.   07/15/2014 at am  . omeprazole (PRILOSEC) 40 MG capsule Take 40 mg by mouth daily.   07/15/2014 at Unknown time  . ondansetron (ZOFRAN) 4 MG tablet Take 4 mg by mouth every 8 (eight) hours as needed for nausea or vomiting.   07/15/2014 at Unknown time  . pravastatin (PRAVACHOL) 40 MG tablet Take 40 mg by mouth at bedtime.    07/14/2014 at Unknown time   Fam HX:    Family History  Problem Relation Age of Onset  . Heart disease Father    Social HX:    History   Social History  . Marital Status: Unknown    Spouse Name: N/A  . Number of Children: N/A  . Years of Education: N/A   Occupational History  . Not on file.   Social History Main Topics  . Smoking status: Never Smoker   . Smokeless tobacco: Not on file  . Alcohol Use: No  . Drug Use: No  . Sexual Activity: Not on file   Other Topics Concern  . Not on file   Social History Narrative     ROS:  All 11 ROS were addressed  and are negative except what is stated in the HPI  Physical Exam: Blood pressure 100/45, pulse 63, temperature 96.5 F (35.8 C), temperature source Axillary, resp. rate 19, height  (1.676 m), weight 128 lb (58.06 kg), SpO2 94 %.    General: Well developed, well nourished, in no acute distress Head: Eyes PERRLA, No xanthomas.   Normal cephalic and atramatic  Lungs:   Wheezes and crackles anteriorly and posteriorly Heart:   HRRR S1 S2 Pulses are 2+ & equal.            No carotid bruit. No JVD.  No abdominal bruits. No femoral bruits. Abdomen: Bowel sounds are positive, abdomen soft and non-tender without masses Extremities:   No clubbing, cyanosis or edema.  DP +1 Neuro: Alert and oriented X 3. Psych:  Good affect, responds appropriately    Labs:   Lab Results    Component Value Date   WBC 24.9* 07/16/2014   HGB 8.8* 07/16/2014   HCT 26.3* 07/16/2014   MCV 89.2 07/16/2014   PLT 229 07/16/2014    Recent Labs Lab 07/15/14 1900  07/16/14 0530  NA 132*  < > 135  K 4.5  < > 4.3  CL 96*  < > 103  CO2 24  < > 24  BUN 40*  < > 30*  CREATININE 3.17*  < > 2.38*  CALCIUM 9.1  < > 7.2*  PROT 7.3  --   --   BILITOT 1.9*  --   --   ALKPHOS 95  --   --   ALT 245*  --   --   AST 555*  --   --   GLUCOSE 120*  < > 161*  < > = values in this interval not displayed. No results found for: PTT Lab Results  Component Value Date   INR 1.21 07/16/2014   No results found for: CKTOTAL, CKMB, CKMBINDEX, TROPONINI  No results found for: CHOL No results found for: HDL No results found for: LDLCALC No results found for: TRIG No results found for: CHOLHDL No results found for: LDLDIRECT    Radiology:  Dg Chest Port 1 View  07/16/2014   CLINICAL DATA:  Status post abdominal aortic aneurysm is arm repair ; history of diabetes.  EXAM: PORTABLE CHEST - 1 VIEW  COMPARISON:  Portable chest x-ray of July 16, 2014  FINDINGS: There remains elevation of the right hemidiaphragm. The interstitial markings of both lungs remain diffusely increased but may have slightly improved since yesterday's study. The cardiac silhouette remains enlarged. The pulmonary vascularity is less engorged. The right internal jugular Cordis sheath tip projects over the proximal portion of the SVC. There is no pleural effusion or pneumothorax. The observed bony thorax is unremarkable.  IMPRESSION: Slight interval improvement in CHF and bilateral pulmonary interstitial edema. Underlying pulmonary fibrotic changes.   Electronically Signed   By: David  [place] M.D.   On: 07/16/2014 07:58   Dg Chest Port 1 View  07/16/2014   CLINICAL DATA:  Central line placement  EXAM: PORTABLE CHEST - 1 VIEW  COMPARISON:  None.  FINDINGS: Right central venous catheter with tip projecting over the low SVC. No  pneumothorax. Normal heart size. Low lung volumes. Diffuse interstitial pattern to both lungs. Correlation with prior CT abdomen and pelvis demonstrates this to represent diffuse parenchymal fibrosis with cystic honeycomb changes. No blunting of costophrenic angles. No pneumothorax.  IMPRESSION: Right central venous catheter appears to be in satisfactory position. Diffuse coarse interstitial  infiltration throughout both lungs with low lung volumes consistent with chronic fibrosis.   Electronically Signed   By: Burman Nieves M.D.   On: 07/16/2014 01:28   Dg Abd Portable 1v  07/16/2014   CLINICAL DATA:  Post abdominal aortic aneurysm surgery.  EXAM: PORTABLE ABDOMEN - 1 VIEW  COMPARISON:  CT abdomen and pelvis 07/15/2014  FINDINGS: Aorto bi-iliac stent graft is present. Bowel gas pattern is normal. Coarse infiltrates in the lung bases suggesting fibrosis.  IMPRESSION: Aorto bi-iliac stent graft. Normal bowel gas pattern. Fibrosis in the lung bases.   Electronically Signed   By: Burman Nieves M.D.   On: 07/16/2014 04:07   Ct Angio Abd/pel W/ And/or W/o  07/15/2014   CLINICAL DATA:  81 year old male with generalized abdominal pain, nausea and vomiting. History of gallstone and abdominal aortic aneurysm.  EXAM: CTA ABDOMEN AND PELVIS wITHOUT AND WITH CONTRAST  TECHNIQUE: Multidetector CT imaging of the abdomen and pelvis was performed using the standard protocol during bolus administration of intravenous contrast. Multiplanar reconstructed images and MIPs were obtained and reviewed to evaluate the vascular anatomy.  CONTRAST:  OMNIPAQUE IOHEXOL 350 MG/ML SOLN  COMPARISON:  None.  FINDINGS: There are emphysematous changes with diffuse ground-glass appearance of the visualized lung bases. There is coronary vascular calcification. Top-normal cardiac size.  No intra-abdominal free air no free fluid.  There is focal heterogeneous uterine enhancement in the right lobe of the liver adjacent to the gallbladder.  No definite lesion of identified. Subcentimeter scattered hypodense lesions are too small to characterize. There are multiple stones within the gallbladder. Trace pericholecystic fluid may be present. The common bile duct is normal in caliber. There is diffuse inflammatory changes and edema of the pancreas compatible with pancreatitis. No drainable fluid collection/abscess or pseudocyst identified. The spleen demonstrates a lobulated contour otherwise unremarkable. The adrenal glands appear unremarkable. The right kidney appears unremarkable. There is a focal area of hypoenhancement involving the inferior pole of the left kidney concerning for a focal hypo profusion/infarct. A small accessory renal artery is noted extending from the distal aorta inferior to the aneurysm into the inferior pole of the left kidney. There is apparent hypo enhancement of the origin of this accessory artery (series 401, image 139 and coronal series 46, image 47) which may account for hypoperfusion of this portion of the left kidney. There is no hydronephrosis on either side. There is apparent diffuse thickening of the bladder wall which may be partly related to underdistention. Cystitis is not excluded. Correlation with urinalysis recommended. The prostate gland and seminal vesicles are grossly unremarkable.  Small hiatal hernia. There is apparent thickening of the stomach and duodenum, likely related to inflammatory changes of the pancreas. There is a 4.4 x 2.7 cm duodenal diverticulum. Inflammatory changes surrounding the diverticulum are likely related to pancreatitis and less likely represent diverticulitis of the duodenum. There is extensive sigmoid and colonic diverticulosis without active inflammatory changes. No evidence of bowel obstruction. Normal appendix.  There is aortoiliac atherosclerotic disease. The aorta is tortuous. There is an infrarenal abdominal aortic aneurysm measuring up to 6.9 cm in greatest axial dimension. This  aneurysm starts approximately 4.2 cm inferior to the origins of the left renal artery and extends to the level of the bifurcation of the aorta. No definite extravasation of the contrast identified outside of the confines of the lumen of the aneurysm. Mild haziness of the periaortic fat is likely related to inflammatory changes extending from the pancreas. There is focal  narrowing of the origin of the SMA with approximately 40- 50% luminal narrowing. There is atherosclerotic calcification of the ostia of the renal arteries. The origins of the celiac axis, IMA as well as the origins of the renal arteries remain patent. There is no lymphadenopathy. The visualized IVC appears unremarkable. No portal venous gas identified. The splenic vein is patent.  Fat containing left inguinal hernia without evidence of inflammation or incarceration. Degenerative changes of the spine. No acute fracture.  Review of the MIP images confirms the above findings.  IMPRESSION: Pancreatitis possibly related to gallstone. No definite stone identified in the central CBD.  Infrarenal abdominal aortic aneurysm. No evidence of extravasation of contrast identified.  Cholelithiasis.  Focal hypoenhancement of the inferior aspect of the left kidney concerning for infarct. A small accessory artery appears to extend from the inferior aorta to the lower pole of the left kidney.  The duodenal diverticulum.  Extensive colonic diverticulosis.  Critical Value/emergent results were called by telephone at the time of interpretation on 07/15/2014 at 8:59 pm to Dr. Derinda Sis , who verbally acknowledged these results.   Electronically Signed   By: Elgie Collard M.D.   On: 07/15/2014 21:50    EKG:  NSR with nonspecific ST abnormality.  Telemetry shows episodes of SVT that is very regular and unlikely to be afib since it is regular and P waves are visible that are a different morphology and axis that his usual P wave in NSR.  Most c/w atrial tachycardia.   He has frequent PAC's in NSR.  ASSESSMENT/PLAN: 1.  SVT - Telemetry shows episodes of SVT that is very regular and unlikely to be afib since it is regular and P waves are visible that are a different morphology and axis than his usual P wave in NSR.  Most c/w atrial tachycardia.  He has frequent PAC's in NSR.  This is most likely catecholamine driven given his acute illness.  No evidence of PAF on monitor.  Would d/c amlodipine and change to Cardizem 30mg  q6 hours and titrate as BP allows.  2.  AAA s/p stent graft 3.  Gallstone pancreatitis - per surgery 4.  SOB with crackles on exam and hypoxia requiring supplemental O2.  Chest xray shows pulmonary fibrosis early this am with no evidence of CHF but followup xray this am says improved CHF with fibrosis.  He did receive over 1L of IVF last night for low BP. Check BNP and if elevated start IV Lasix as BP tolerates.  2D echo to assess LVF.  D/C Losartan/HCTZ due to soft BP.  D/C IVF.    Quintella Reichert, MD  07/16/2014  9:49 AM

## 2014-07-16 NOTE — Progress Notes (Signed)
  [facility] Progress Note  Subjective  - POD #1  Denies abdominal pain this morning. Per family, was nauseous last night with dry heaving.   Objective Filed Vitals:   07/16/14 0600  BP: 106/49  Pulse: 71  Temp:   Resp: 21   Tmax 97.3 BP sys 110s-140s 02 92% Venturi 35%   Intake/Output Summary (Last 24 hours) at 07/16/14 0759 Last data filed at 07/16/14 0600  Gross per 24 hour  Intake   3050 ml  Output    850 ml  Net   2200 ml    General: resting in bed in NAD Lungs: diffuse rhonchi  CV: RRR Abdomen: soft, nontender, no distension Incisions: b/l groins soft without hematoma. Left inguinal hernia (is chronic) Extremities: palpable dorsalis pedis pulses bilaterally  Assessment/Planning: 81 y.o. male is s/p: EVAR for symptomatic AAA without rupture and hypotension, pancreatitis 1 Day Post-Op   Stable post-operatively. Did require two 500 cc boluses for blood pressure. BP now stable. Creatinine trending down. Metformin held.  Acute blood loss anemia stable. Tolerating.  Has known pancreatitis with gallstones on CT scan. General surgery consult.  Keep in 3S today.   Raymond Gurney 07/16/2014 7:59 AM --  Laboratory CBC    Component Value Date/Time   WBC 24.9* 07/16/2014 0510   HGB 8.8* 07/16/2014 0510   HCT 26.3* 07/16/2014 0510   PLT 229 07/16/2014 0510    BMET    Component Value Date/Time   NA 135 07/16/2014 0530   K 4.3 07/16/2014 0530   CL 103 07/16/2014 0530   CO2 24 07/16/2014 0530   GLUCOSE 161* 07/16/2014 0530   BUN 30* 07/16/2014 0530   CREATININE 2.38* 07/16/2014 0530   CREATININE 2.25* 07/13/2014 1036   CALCIUM 7.2* 07/16/2014 0530   GFRNONAA 22* 07/16/2014 0530   GFRAA 26* 07/16/2014 0530    COAG Lab Results  Component Value Date   INR 1.21 07/16/2014   No results found for: PTT  Antibiotics Anti-infectives    Start     Dose/Rate Route Frequency Ordered Stop   07/16/14 0800  cefUROXime (ZINACEF) 1.5 g in  dextrose 5 % 50 mL IVPB     1.5 g 100 mL/hr over 30 Minutes Intravenous Every 12 hours 07/16/14 0041 07/17/14 0759       Maris Berger, PA-C [facility] Office: [phone] Pager: [phone] 07/16/2014 7:59 AM   Agree with above.  Gentle hydration for now for pancreatitis and reduce risk of contrast nephropathy Cardiology consult for paroxysmal afib.  Spoke with Dr Jearld Pies last night General surgery consult for management of what is most likely gallstone pancreatitis Follow lipase for now, check LFT Keep NPO for now Consider TPN if remains NPO Acute blood loss anemia trend for now  Fabienne Bruns, MD [facility] [place] Office: [phone] Pager: [phone]

## 2014-07-16 NOTE — Consult Note (Signed)
[facility] Surgery Consult Note  Christian Dominguez Feb 13, 1921  [id number].    Requesting MD: Dr. Oneida Alar Chief Complaint/Reason for Consult: Gallstone pancreatitis  HPI:  81 y/o white male with a known h/o 6.2cm AAA who presents to [facility] with abdominal pain, N/V and dry heaving since 07/16/14.  He had a similar episode a month ago and he was seen at an UC and found to have gallstone pancreatitis with lipase in the 500's.  He was referred to a general surgeon who wanted him to have his AAA repaired prior to considering lap chole.  They referred him to [facility] in [place].  Last night he was not feeling well so his family brought him to the ER.  He was seen in [place] for cardiac clearance on Tuesday and had a lexi scan and echo which were reportedly normal.    His granddaughter, a PA, is able to fill in the story for me.  He has actually had some anorexia for at least several weeks associated with dry heaving when he sees/smells food.  His lipase today is >3000, his AST 555, ALT 245, Bili 1.9, Cr. 2.38.  Yesterday he had a stent graft repair  of his AAA by Dr. Oneida Alar which went well.  There is some concern for SOB/arrhythmia ?AFIB and cardiology has been consulted.  His pain, N/V, and dry heaving has resolved and he feels much better today.  We were consulted regarding taking out his gallbladder prior to discharge given multiple episodes of pancreatitis and gallstones.  Denies chest pain, bowel/bladder problems.  Does have a h/o of a large inguinal hernia which has been there for 20 years which is soft.    ROS: All systems reviewed and otherwise negative except for as above  Family History  Problem Relation Age of Onset  . Heart disease Father     Past Medical History  Diagnosis Date  . Calculus of gallbladder with cholecystitis   . AAA (abdominal aortic aneurysm)   . Arthritis   . Diabetes mellitus without complication   . Esophageal reflux   . Hearing loss of aging   .  Hyperlipidemia   . Thyroid disease     History reviewed. No pertinent past surgical history.  Social History:  reports that he has never smoked. He does not have any smokeless tobacco history on file. He reports that he does not drink alcohol or use illicit drugs.  Allergies: No Known Allergies  Medications Prior to Admission  Medication Sig Dispense Refill  . albuterol (PROVENTIL HFA;VENTOLIN HFA) 108 (90 BASE) MCG/ACT inhaler Inhale into the lungs every 6 (six) hours as needed for wheezing or shortness of breath.    Marland Kitchen amLODipine (NORVASC) 10 MG tablet Take 10 mg by mouth at bedtime.     Marland Kitchen levothyroxine (LEVOTHROID) 50 MCG tablet Take 50 mcg by mouth daily before breakfast.    . losartan-hydrochlorothiazide (HYZAAR) 100-12.5 MG per tablet Take 1 tablet by mouth daily.    . metFORMIN (GLUCOPHAGE) 1000 MG tablet Take 1,000 mg by mouth 2 (two) times daily with a meal.    . omeprazole (PRILOSEC) 40 MG capsule Take 40 mg by mouth daily.    . ondansetron (ZOFRAN) 4 MG tablet Take 4 mg by mouth every 8 (eight) hours as needed for nausea or vomiting.    . pravastatin (PRAVACHOL) 40 MG tablet Take 40 mg by mouth at bedtime.       Blood pressure 110/46, pulse 68, temperature 96.5 F (35.8 C),  temperature source Axillary, resp. rate 24, height 5' 6"  (1.676 m), weight 58.06 kg (128 lb), SpO2 91 %. Physical Exam: General: pleasant, WD/WN white male who is laying in bed in NAD HEENT: head is normocephalic, atraumatic.  Sclera are noninjected.  PERRL.  Ears and nose without any masses or lesions.  Mouth is pink and moist Heart: regular, rate, and rhythm.  No obvious murmurs, gallops, or rubs noted.  Palpable pedal pulses bilaterally Lungs: Non-rebreather on.  CTAB, no wheezes, rhonchi, or rales noted.  Respiratory effort nonlabored, good effort Abd: soft, NT/ND, +BS, no masses, hernias, or organomegaly, large soft left inguinal hernia which is partially reducible and minimally tender, no abdominal  scars MS: all 4 extremities are symmetrical with no cyanosis, clubbing, or edema. Skin: warm and dry with no masses, lesions, or rashes Psych: A&Ox3 with an appropriate affect.   Results for orders placed or performed during the hospital encounter of 07/15/14 (from the past 48 hour(s))  Lipase, blood     Status: Abnormal   Collection Time: 07/15/14  7:00 PM  Result Value Ref Range   Lipase >3000 (H) 22 - 51 U/L    Comment: RESULTS CONFIRMED BY MANUAL DILUTION  Comprehensive metabolic panel     Status: Abnormal   Collection Time: 07/15/14  7:00 PM  Result Value Ref Range   Sodium 132 (L) 135 - 145 mmol/L   Potassium 4.5 3.5 - 5.1 mmol/L   Chloride 96 (L) 101 - 111 mmol/L   CO2 24 22 - 32 mmol/L   Glucose, Bld 120 (H) 65 - 99 mg/dL   BUN 40 (H) 6 - 20 mg/dL   Creatinine, Ser 3.17 (H) 0.61 - 1.24 mg/dL   Calcium 9.1 8.9 - 10.3 mg/dL   Total Protein 7.3 6.5 - 8.1 g/dL   Albumin 3.8 3.5 - 5.0 g/dL   AST 555 (H) 15 - 41 U/L   ALT 245 (H) 17 - 63 U/L   Alkaline Phosphatase 95 38 - 126 U/L   Total Bilirubin 1.9 (H) 0.3 - 1.2 mg/dL   GFR calc non Af Amer 16 (L) >60 mL/min   GFR calc Af Amer 18 (L) >60 mL/min    Comment: (NOTE) The eGFR has been calculated using the CKD EPI equation. This calculation has not been validated in all clinical situations. eGFR's persistently <60 mL/min signify possible Chronic Kidney Disease.    Anion gap 12 5 - 15  CBC     Status: Abnormal   Collection Time: 07/15/14  7:00 PM  Result Value Ref Range   WBC 24.8 (H) 4.0 - 10.5 K/uL   RBC 4.04 (L) 4.22 - 5.81 MIL/uL   Hemoglobin 12.2 (L) 13.0 - 17.0 g/dL   HCT 36.5 (L) 39.0 - 52.0 %   MCV 90.3 78.0 - 100.0 fL   MCH 30.2 26.0 - 34.0 pg   MCHC 33.4 30.0 - 36.0 g/dL   RDW 14.3 11.5 - 15.5 %   Platelets 322 150 - 400 K/uL  Type and screen     Status: None (Preliminary result)   Collection Time: 07/15/14  8:04 PM  Result Value Ref Range   ABO/RH(D) A POS    Antibody Screen NEG    Sample Expiration  07/18/2014    Unit Number [id number]    Blood Component Type RED CELLS,LR    Unit division 00    Status of Unit REL FROM [facility]    Transfusion Status OK TO TRANSFUSE    Crossmatch Result  Compatible    Unit Number [id number]    Blood Component Type RED CELLS,LR    Unit division 00    Status of Unit REL FROM [facility]    Transfusion Status OK TO TRANSFUSE    Crossmatch Result Compatible    Unit Number [id number]    Blood Component Type RED CELLS,LR    Unit division 00    Status of Unit ALLOCATED    Transfusion Status OK TO TRANSFUSE    Crossmatch Result Compatible    Unit Number [id number]    Blood Component Type RED CELLS,LR    Unit division 00    Status of Unit ALLOCATED    Transfusion Status OK TO TRANSFUSE    Crossmatch Result Compatible   ABO/Rh     Status: None   Collection Time: 07/15/14  8:04 PM  Result Value Ref Range   ABO/RH(D) A POS   I-Stat CG4 Lactic Acid, ED     Status: Abnormal   Collection Time: 07/15/14  8:12 PM  Result Value Ref Range   Lactic Acid, Venous 2.28 (HH) 0.5 - 2.0 mmol/L   Comment NOTIFIED PHYSICIAN   I-STAT 7, (LYTES, BLD GAS, ICA, H+H)     Status: Abnormal   Collection Time: 07/15/14  9:52 PM  Result Value Ref Range   pH, Arterial 7.317 (L) 7.350 - 7.450   pCO2 arterial 42.2 35.0 - 45.0 mmHg   pO2, Arterial 122.0 (H) 80.0 - 100.0 mmHg   Bicarbonate 21.8 20.0 - 24.0 mEq/L   TCO2 23 0 - 100 mmol/L   O2 Saturation 99.0 %   Acid-base deficit 4.0 (H) 0.0 - 2.0 mmol/L   Sodium 134 (L) 135 - 145 mmol/L   Potassium 3.9 3.5 - 5.1 mmol/L   Calcium, Ion 1.15 1.13 - 1.30 mmol/L   HCT 31.0 (L) 39.0 - 52.0 %   Hemoglobin 10.5 (L) 13.0 - 17.0 g/dL   Patient temperature 36.0 C    Collection site RADIAL, ALLEN'S TEST ACCEPTABLE    Sample type ARTERIAL   Prepare RBC     Status: None   Collection Time: 07/15/14 10:00 PM  Result Value Ref Range   Order Confirmation ORDER PROCESSED BY BLOOD BANK   Glucose, capillary     Status: Abnormal    Collection Time: 07/16/14 12:21 AM  Result Value Ref Range   Glucose-Capillary 247 (H) 65 - 99 mg/dL   Comment 1 Notify RN   CBC     Status: Abnormal   Collection Time: 07/16/14 12:48 AM  Result Value Ref Range   WBC 33.1 (H) 4.0 - 10.5 K/uL   RBC 3.22 (L) 4.22 - 5.81 MIL/uL   Hemoglobin 9.7 (L) 13.0 - 17.0 g/dL   HCT 28.7 (L) 39.0 - 52.0 %   MCV 89.1 78.0 - 100.0 fL   MCH 30.1 26.0 - 34.0 pg   MCHC 33.8 30.0 - 36.0 g/dL   RDW 14.2 11.5 - 15.5 %   Platelets 242 150 - 400 K/uL  Basic metabolic panel     Status: Abnormal   Collection Time: 07/16/14 12:48 AM  Result Value Ref Range   Sodium 130 (L) 135 - 145 mmol/L   Potassium 4.1 3.5 - 5.1 mmol/L   Chloride 99 (L) 101 - 111 mmol/L   CO2 23 22 - 32 mmol/L   Glucose, Bld 259 (H) 65 - 99 mg/dL   BUN 33 (H) 6 - 20 mg/dL   Creatinine, Ser 2.68 (H) 0.61 - 1.24 mg/dL  Calcium 7.5 (L) 8.9 - 10.3 mg/dL   GFR calc non Af Amer 19 (L) >60 mL/min   GFR calc Af Amer 23 (L) >60 mL/min    Comment: (NOTE) The eGFR has been calculated using the CKD EPI equation. This calculation has not been validated in all clinical situations. eGFR's persistently <60 mL/min signify possible Chronic Kidney Disease.    Anion gap 8 5 - 15  Protime-INR     Status: Abnormal   Collection Time: 07/16/14 12:48 AM  Result Value Ref Range   Prothrombin Time 15.5 (H) 11.6 - 15.2 seconds   INR 1.21 0.00 - 1.49  APTT     Status: None   Collection Time: 07/16/14 12:48 AM  Result Value Ref Range   aPTT 30 24 - 37 seconds  Magnesium     Status: Abnormal   Collection Time: 07/16/14 12:48 AM  Result Value Ref Range   Magnesium 1.2 (L) 1.7 - 2.4 mg/dL  MRSA PCR Screening     Status: None   Collection Time: 07/16/14  2:06 AM  Result Value Ref Range   MRSA by PCR NEGATIVE NEGATIVE    Comment:        The GeneXpert MRSA Assay (FDA approved for NASAL specimens only), is one component of a comprehensive MRSA colonization surveillance program. It is not intended to  diagnose MRSA infection nor to guide or monitor treatment for MRSA infections.   Glucose, capillary     Status: Abnormal   Collection Time: 07/16/14  2:49 AM  Result Value Ref Range   Glucose-Capillary 230 (H) 65 - 99 mg/dL  CBC     Status: Abnormal   Collection Time: 07/16/14  5:10 AM  Result Value Ref Range   WBC 24.9 (H) 4.0 - 10.5 K/uL   RBC 2.95 (L) 4.22 - 5.81 MIL/uL   Hemoglobin 8.8 (L) 13.0 - 17.0 g/dL   HCT 26.3 (L) 39.0 - 52.0 %   MCV 89.2 78.0 - 100.0 fL   MCH 29.8 26.0 - 34.0 pg   MCHC 33.5 30.0 - 36.0 g/dL   RDW 14.5 11.5 - 15.5 %   Platelets 229 150 - 400 K/uL  Basic metabolic panel     Status: Abnormal   Collection Time: 07/16/14  5:30 AM  Result Value Ref Range   Sodium 135 135 - 145 mmol/L   Potassium 4.3 3.5 - 5.1 mmol/L   Chloride 103 101 - 111 mmol/L   CO2 24 22 - 32 mmol/L   Glucose, Bld 161 (H) 65 - 99 mg/dL   BUN 30 (H) 6 - 20 mg/dL   Creatinine, Ser 2.38 (H) 0.61 - 1.24 mg/dL   Calcium 7.2 (L) 8.9 - 10.3 mg/dL   GFR calc non Af Amer 22 (L) >60 mL/min   GFR calc Af Amer 26 (L) >60 mL/min    Comment: (NOTE) The eGFR has been calculated using the CKD EPI equation. This calculation has not been validated in all clinical situations. eGFR's persistently <60 mL/min signify possible Chronic Kidney Disease.    Anion gap 8 5 - 15  Glucose, capillary     Status: Abnormal   Collection Time: 07/16/14  8:17 AM  Result Value Ref Range   Glucose-Capillary 107 (H) 65 - 99 mg/dL   Comment 1 Notify RN    Comment 2 Document in Chart    Dg Chest Port 1 View  07/16/2014   CLINICAL DATA:  Status post abdominal aortic aneurysm is arm repair ; history  of diabetes.  EXAM: PORTABLE CHEST - 1 VIEW  COMPARISON:  Portable chest x-ray of July 16, 2014  FINDINGS: There remains elevation of the right hemidiaphragm. The interstitial markings of both lungs remain diffusely increased but may have slightly improved since yesterday's study. The cardiac silhouette remains enlarged.  The pulmonary vascularity is less engorged. The right internal jugular Cordis sheath tip projects over the proximal portion of the SVC. There is no pleural effusion or pneumothorax. The observed bony thorax is unremarkable.  IMPRESSION: Slight interval improvement in CHF and bilateral pulmonary interstitial edema. Underlying pulmonary fibrotic changes.   Electronically Signed   By: David  Martinique M.D.   On: 07/16/2014 07:58   Dg Chest Port 1 View  07/16/2014   CLINICAL DATA:  Central line placement  EXAM: PORTABLE CHEST - 1 VIEW  COMPARISON:  None.  FINDINGS: Right central venous catheter with tip projecting over the low SVC. No pneumothorax. Normal heart size. Low lung volumes. Diffuse interstitial pattern to both lungs. Correlation with prior CT abdomen and pelvis demonstrates this to represent diffuse parenchymal fibrosis with cystic honeycomb changes. No blunting of costophrenic angles. No pneumothorax.  IMPRESSION: Right central venous catheter appears to be in satisfactory position. Diffuse coarse interstitial infiltration throughout both lungs with low lung volumes consistent with chronic fibrosis.   Electronically Signed   By: Lucienne Capers M.D.   On: 07/16/2014 01:28   Dg Abd Portable 1v  07/16/2014   CLINICAL DATA:  Post abdominal aortic aneurysm surgery.  EXAM: PORTABLE ABDOMEN - 1 VIEW  COMPARISON:  CT abdomen and pelvis 07/15/2014  FINDINGS: Aorto bi-iliac stent graft is present. Bowel gas pattern is normal. Coarse infiltrates in the lung bases suggesting fibrosis.  IMPRESSION: Aorto bi-iliac stent graft. Normal bowel gas pattern. Fibrosis in the lung bases.   Electronically Signed   By: Lucienne Capers M.D.   On: 07/16/2014 04:07   Ct Angio Abd/pel W/ And/or W/o  07/15/2014   CLINICAL DATA:  81 year old male with generalized abdominal pain, nausea and vomiting. History of gallstone and abdominal aortic aneurysm.  EXAM: CTA ABDOMEN AND PELVIS wITHOUT AND WITH CONTRAST  TECHNIQUE:  Multidetector CT imaging of the abdomen and pelvis was performed using the standard protocol during bolus administration of intravenous contrast. Multiplanar reconstructed images and MIPs were obtained and reviewed to evaluate the vascular anatomy.  CONTRAST:  125m OMNIPAQUE IOHEXOL 350 MG/ML SOLN  COMPARISON:  None.  FINDINGS: There are emphysematous changes with diffuse ground-glass appearance of the visualized lung bases. There is coronary vascular calcification. Top-normal cardiac size.  No intra-abdominal free air no free fluid.  There is focal heterogeneous uterine enhancement in the right lobe of the liver adjacent to the gallbladder. No definite lesion of identified. Subcentimeter scattered hypodense lesions are too small to characterize. There are multiple stones within the gallbladder. Trace pericholecystic fluid may be present. The common bile duct is normal in caliber. There is diffuse inflammatory changes and edema of the pancreas compatible with pancreatitis. No drainable fluid collection/abscess or pseudocyst identified. The spleen demonstrates a lobulated contour otherwise unremarkable. The adrenal glands appear unremarkable. The right kidney appears unremarkable. There is a focal area of hypoenhancement involving the inferior pole of the left kidney concerning for a focal hypo profusion/infarct. A small accessory renal artery is noted extending from the distal aorta inferior to the aneurysm into the inferior pole of the left kidney. There is apparent hypo enhancement of the origin of this accessory artery (series 401, image 139 and  coronal series 46, image 47) which may account for hypoperfusion of this portion of the left kidney. There is no hydronephrosis on either side. There is apparent diffuse thickening of the bladder wall which may be partly related to underdistention. Cystitis is not excluded. Correlation with urinalysis recommended. The prostate gland and seminal vesicles are grossly  unremarkable.  Small hiatal hernia. There is apparent thickening of the stomach and duodenum, likely related to inflammatory changes of the pancreas. There is a 4.4 x 2.7 cm duodenal diverticulum. Inflammatory changes surrounding the diverticulum are likely related to pancreatitis and less likely represent diverticulitis of the duodenum. There is extensive sigmoid and colonic diverticulosis without active inflammatory changes. No evidence of bowel obstruction. Normal appendix.  There is aortoiliac atherosclerotic disease. The aorta is tortuous. There is an infrarenal abdominal aortic aneurysm measuring up to 6.9 cm in greatest axial dimension. This aneurysm starts approximately 4.2 cm inferior to the origins of the left renal artery and extends to the level of the bifurcation of the aorta. No definite extravasation of the contrast identified outside of the confines of the lumen of the aneurysm. Mild haziness of the periaortic fat is likely related to inflammatory changes extending from the pancreas. There is focal narrowing of the origin of the SMA with approximately 40- 50% luminal narrowing. There is atherosclerotic calcification of the ostia of the renal arteries. The origins of the celiac axis, IMA as well as the origins of the renal arteries remain patent. There is no lymphadenopathy. The visualized IVC appears unremarkable. No portal venous gas identified. The splenic vein is patent.  Fat containing left inguinal hernia without evidence of inflammation or incarceration. Degenerative changes of the spine. No acute fracture.  Review of the MIP images confirms the above findings.  IMPRESSION: Pancreatitis possibly related to gallstone. No definite stone identified in the central CBD.  Infrarenal abdominal aortic aneurysm. No evidence of extravasation of contrast identified.  Cholelithiasis.  Focal hypoenhancement of the inferior aspect of the left kidney concerning for infarct. A small accessory artery appears to  extend from the inferior aorta to the lower pole of the left kidney.  The duodenal diverticulum.  Extensive colonic diverticulosis.  Critical Value/emergent results were called by telephone at the time of interpretation on 07/15/2014 at 8:59 pm to Dr. Trey Sailors , who verbally acknowledged these results.   Electronically Signed   By: Anner Crete M.D.   On: 07/15/2014 21:50      Assessment/Plan Gallstone pancreatitis -Lipase >3000, will need to see if cardiology could clear this patient for surgery.  Normally would recommend a lap chole prior to discharge due to likelihood of recurrent pancreatitis, but need to fully consider whether he could undergo a surgery.  Potentially could operate tomorrow if pain does not return.   -IVF, pain control, antiemetics -Ambulate and IS -SCD's and lovenox, would need to hold long acting anticoagulation  Cholelithiasis AAA without rupture but with hypotension s/p stent graft POD #1 -per vascular surgery Leukocytosis - 24.9 AKI Paroxysmal AFib -Per cardiology Colonic diverticulosis and duodenal diverticulum Large fat containing left inguinal hernia - soft and partially reducible    Nat Christen, [facility] Surgery 07/16/2014, 10:12 AM Pager: [phone]

## 2014-07-16 NOTE — Op Note (Signed)
Procedure: Gore Excluder bifurcated stent graft for repair of Abdominal aortic Aneurysm          PreOp: symptomatic AAA PostOp: same  Asst: Karsten Ro PA-C  Anesthesia: General  Operative findings:   #1 Bilateral Proglide closure   #2 23x14x12 cm main body Gore Excluder device delivered via a left femoral system   #3 16 x 13.5 cm right iliac limb contralateral common iliac    #4 16 x 11.5 ipsilateral iliac extension right external ilac   #5 Paroxysmal Afib intraop    PROCEDURE DETAIL: After obtaining informed consent the patient was taken to the operating room. The patient was placed in supine position on the operating room table. After induction of general anesthesia and endotracheal intubation a Foley catheter was placed. Next the patient was prepped and draped in usual sterile fashion from the nipples down to the knees. Ultrasound was used to identify the right common femoral artery as well as the femoral bifurcation. An 11 blade was used to make a small neck in the skin over the level of the right common femoral artery. An introducer needle was then used to cannulate the right common femoral artery without difficulty. A 0.035 Bentson wire was then threaded up into the abdominal aorta through the right femoral system. A short 9 French dilator was placed over the guidewire the right femoral system. This was used to dilate the tract. The dilator was then removed and a Proglide device inserted over the guidewire into the right femoral system and this was deployed at the 2:00 position. The Proglide device was then removed and an additional Proglide device was brought in operative field and deployed at the 10:00 position. The sutures were kept separate and tagged with suture tags. Next the short 9 French short sheath was then placed back over the guidewire into the right femoral system and the dilator removed and the sheath thoroughly flushed with heparinized saline. Attention was then turned to  the left groin. Again using ultrasound the left common femoral artery was identified. The femoral bifurcation was also identified. A small nick was made in the skin with the 11 blade. A hemostat was used to dilate a tract down to the artery. An introducer needle was then used to cannulate the left common femoral artery without difficulty. A 0.035 Bentson wire was then threaded up into the abdominal aorta under fluoroscopic guidance. A 9 French dilator was then placed over the wire to dilate the tract. Two Proglide devices were again brought on operative field and these were fired sequentially in the 10:00 position followed by an additional Proglide at the 2:00 position. The Proglide delivery systems were removed over an 035 Amplatz wire.   A 16 Fr Gore dry seal sheath was placed over the wire into the aorta.  On the right side there was more tortuosity to the iliac system so a Berenstein 2 catheter was used to direct the wire up into the aorta.  A 12 Fr dry seal sheath was placed over this up into the aorta.  The Omni flush catheter was then introduced via the right femoral system.  An abdominal aortogram was obtained in a 25 degree RAO 10 degree cranial position to determine level of the left and right renal arteries. At this point a 23 x 14 x 12 Gore Excluder main body device was selected. The main body device was then placed over the Amplatz wire in the left groin and advanced up to the level of the renal  arteries. Magnified views of the renal arteries were performed to make sure that we were not covering these. The top portion of the stent graft was then deployed with the end of the stent just below the level of the right renal artery and this came over to just below the level of the left renal artery. The main body was delivered all the way down to the contralateral gate. Attention was then turned to the right groin. The Omni flush catheter was pulled down over a Bentsen guidewire and an additional Bentson  wire placed in position to cannulate the contralateral gate using a buddy wire technique. The Berenstiein catheter was placed back over the guidewire in the right femoral system.  This was used to selectively catheterize the contralateral gate and the guidewire was then advanced into the descending thoracic aorta. The main body portion of the gate was confirmed by twirling the omni flush catheter. The catheter was then placed in a location so that we could use its markers to determine the exact length to the iliac bifurcation. An Amplatz wire was placed back through the pigtail catheter. A retrograde contrast angiogram was performed to determine the level of the right internal iliac artery and a 16 x 13.5 cm iliac limb was selected. The catheter was removed over the guidewire and the 16 x 13.5 cm limb advanced so there was full coverage of the long marker on the contralateral limb. This was then deployed in the usual fashion down to the iliac bifurcation. The delivery system was then removed. The remainder of the ipsilateral iliac limb was also deployed.  A retrograde contrast angiogram was then performed to determine length of the leftt iliac extension to extend into the distal common iliac artery.  Measurement was made of the left iliac bifurcation and a 16 x11.5 cm ipsilateral limb was placed.  Attention was then turned back to the left iliac system and a Gore aortic balloon placed over the wire up to the level of the proximal end of the stent and this was ballooned to profile. The limb attachment site was also ballooned as well as the distal attachment site. Attention was then turned to the right groin and the balloon was advanced over the guidewire on the right side and the distal attachment site as well as the midportion of iliac limb were also ballooned. The 5 [place] Omni flush catheter was then placed back to the guidewire on the right side and a completion arteriogram was obtained. This showed no evidence  of proximal or distal type I endoleak. 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  The left and right internal iliacs and renals were patent.  At this point the Omni flush catheter was removed over a guidewire. All delivery devices were removed. We then proceeded to remove the large 18 French sheath from the right side with the guidewire in place. With pressure held above and below the insertion site the lateral Proglide closure was secured down. There was good hemostasis. The guidewire was removed from the right side. Attention was then turned to the left groin. In similar fashion the 16 French sheath was removed and the guidewire left in place.With pressure held above and below the insertion site the lateral and medial Proglide closures were secured down. There was good hemostasis and the Amplatz wire was removed from the left groin. The patient had been given 6000 units of heparin before introducing the main body device. This was fully reversed  with 60 mg of protamine at the end of the case. Each groin puncture site was then closed with a running 4-0 Vicryl subcuticular stitch.  The patient had palpable DP pulses at the end of the case.  The patient tolerated procedure well and there were no complications. Instrument sponge and needle counts correct in the case. Patient was awakened in the operating room extubated and taken to the recovery room in stable condition.  He did have paroxysmal afib during the case.  Fabienne Bruns, MD [facility] Office: [phone] Pager: [phone]

## 2014-07-16 NOTE — Care Management Note (Addendum)
Case Management Note  Patient Details  Name: Sabin Molton MRN: [id number] Date of Birth: 1921/07/28  Subjective/Objective:          Pt form home with wife admitted with  AAA, and pancreatitis. S/P aaa repair 07/15/14.         Action/Plan:  CCS following ,plan: ? lap. cholecy. with IOC on 07/17/14. Pt to return home when medically stable. CM to f/u with disposition needs. Expected Discharge Date:                  Expected Discharge Plan:  Home w Home Health Services  In-House Referral:     Discharge planning Services  CM Consult  Post Acute Care Choice:    Choice offered to:     DME Arranged:    DME Agency:     HH Arranged:    HH Agency:     Status of Service:  In process, will continue to follow  Medicare Important Message Given:    Date Medicare IM Given:    Medicare IM give by:    Date Additional Medicare IM Given:    Additional Medicare Important Message give by:     If discussed at Long Length of Stay Meetings, dates discussed:    Additional Comments: Colletta [place] (Daughter) [phone]  Gae Gallop [place], [place] [phone] 07/16/2014, 3:42 PM

## 2014-07-16 NOTE — Progress Notes (Signed)
D; transferred to 3S09 via bed, noted Lt groin bulging, soft. Notified MD, said that hernia.  Alert oriented, VSS, family @ waiting area 3S.

## 2014-07-16 NOTE — Transfer of Care (Signed)
Immediate Anesthesia Transfer of Care Note  Patient: Christian Dominguez  Procedure(s) Performed: Procedure(s): ABDOMINAL AORTIC ENDOVASCULAR STENT GRAFT (N/A)  Patient Location: PACU  Anesthesia Type:General  Level of Consciousness: awake  Airway & Oxygen Therapy: Patient Spontanous Breathing and Patient connected to face mask oxygen  Post-op Assessment: Report given to RN and Post -op Vital signs reviewed and stable  Post vital signs: Reviewed and stable  Last Vitals:  Filed Vitals:   07/16/14 0017  BP:   Pulse:   Temp: 36.1 C  Resp:     Complications: No apparent anesthesia complications

## 2014-07-16 NOTE — Anesthesia Postprocedure Evaluation (Signed)
  Anesthesia Post-op Note  Patient: Christian Dominguez  Procedure(s) Performed: Procedure(s): ABDOMINAL AORTIC ENDOVASCULAR STENT GRAFT (N/A)  Patient Location: PACU  Anesthesia Type:General  Level of Consciousness: awake and alert   Airway and Oxygen Therapy: Patient Spontanous Breathing  Post-op Pain: none  Post-op Assessment: Post-op Vital signs reviewed              Post-op Vital Signs: Reviewed  Last Vitals:  Filed Vitals:   07/16/14 0600  BP: 106/49  Pulse: 71  Temp:   Resp: 21    Complications: No apparent anesthesia complications

## 2014-07-16 NOTE — Progress Notes (Signed)
Pt. Has had instances of low BP throughout the night. 2 boluses of 500 given, BP has stabilized.  No change to Left groin hernia since transfer, pulses palpable.  Will continue to monitor.

## 2014-07-17 ENCOUNTER — Encounter (HOSPITAL_COMMUNITY): Payer: Self-pay | Admitting: Radiology

## 2014-07-17 ENCOUNTER — Inpatient Hospital Stay (HOSPITAL_COMMUNITY): Payer: Medicare Other

## 2014-07-17 DIAGNOSIS — J9601 Acute respiratory failure with hypoxia: Secondary | ICD-10-CM

## 2014-07-17 DIAGNOSIS — J8410 Pulmonary fibrosis, unspecified: Secondary | ICD-10-CM

## 2014-07-17 DIAGNOSIS — J810 Acute pulmonary edema: Secondary | ICD-10-CM

## 2014-07-17 DIAGNOSIS — I509 Heart failure, unspecified: Secondary | ICD-10-CM

## 2014-07-17 LAB — BASIC METABOLIC PANEL
ANION GAP: 11 (ref 5–15)
BUN: 28 mg/dL — ABNORMAL HIGH (ref 6–20)
CO2: 24 mmol/L (ref 22–32)
Calcium: 7.7 mg/dL — ABNORMAL LOW (ref 8.9–10.3)
Chloride: 97 mmol/L — ABNORMAL LOW (ref 101–111)
Creatinine, Ser: 2.3 mg/dL — ABNORMAL HIGH (ref 0.61–1.24)
GFR calc Af Amer: 27 mL/min — ABNORMAL LOW (ref >60)
GFR calc non Af Amer: 23 mL/min — ABNORMAL LOW (ref >60)
GLUCOSE: 172 mg/dL — AB (ref 65–99)
POTASSIUM: 4.3 mmol/L (ref 3.5–5.1)
Sodium: 132 mmol/L — ABNORMAL LOW (ref 135–145)

## 2014-07-17 LAB — COMPREHENSIVE METABOLIC PANEL
ALT: 119 U/L — ABNORMAL HIGH (ref 17–63)
ANION GAP: 7 (ref 5–15)
AST: 136 U/L — AB (ref 15–41)
Albumin: 2.3 g/dL — ABNORMAL LOW (ref 3.5–5.0)
Alkaline Phosphatase: 90 U/L (ref 38–126)
BILIRUBIN TOTAL: 0.5 mg/dL (ref 0.3–1.2)
BUN: 26 mg/dL — ABNORMAL HIGH (ref 6–20)
CHLORIDE: 106 mmol/L (ref 101–111)
CO2: 22 mmol/L (ref 22–32)
Calcium: 7.3 mg/dL — ABNORMAL LOW (ref 8.9–10.3)
Creatinine, Ser: 2.09 mg/dL — ABNORMAL HIGH (ref 0.61–1.24)
GFR, EST AFRICAN AMERICAN: 30 mL/min — AB (ref >60)
GFR, EST NON AFRICAN AMERICAN: 26 mL/min — AB (ref >60)
Glucose, Bld: 94 mg/dL (ref 65–99)
Potassium: 4.3 mmol/L (ref 3.5–5.1)
SODIUM: 135 mmol/L (ref 135–145)
TOTAL PROTEIN: 4.9 g/dL — AB (ref 6.5–8.1)

## 2014-07-17 LAB — GLUCOSE, CAPILLARY
GLUCOSE-CAPILLARY: 95 mg/dL (ref 65–99)
GLUCOSE-CAPILLARY: 100 mg/dL — AB (ref 65–99)
GLUCOSE-CAPILLARY: 101 mg/dL — AB (ref 65–99)
GLUCOSE-CAPILLARY: 166 mg/dL — AB (ref 65–99)
Glucose-Capillary: 145 mg/dL — ABNORMAL HIGH (ref 65–99)

## 2014-07-17 LAB — LIPASE, BLOOD: Lipase: 703 U/L — ABNORMAL HIGH (ref 22–51)

## 2014-07-17 LAB — CBC
HCT: 24.8 % — ABNORMAL LOW (ref 39.0–52.0)
HEMOGLOBIN: 8.4 g/dL — AB (ref 13.0–17.0)
MCH: 29.9 pg (ref 26.0–34.0)
MCHC: 33.9 g/dL (ref 30.0–36.0)
MCV: 88.3 fL (ref 78.0–100.0)
PLATELETS: 205 10*3/uL (ref 150–400)
RBC: 2.81 MIL/uL — AB (ref 4.22–5.81)
RDW: 14.6 % (ref 11.5–15.5)
WBC: 19 10*3/uL — AB (ref 4.0–10.5)

## 2014-07-17 LAB — BRAIN NATRIURETIC PEPTIDE: B Natriuretic Peptide: 307.7 pg/mL — ABNORMAL HIGH (ref 0.0–100.0)

## 2014-07-17 LAB — PREPARE RBC (CROSSMATCH)

## 2014-07-17 MED ORDER — SODIUM CHLORIDE 0.9 % IV SOLN
Freq: Once | INTRAVENOUS | Status: AC
  Administered 2014-07-17: 12:00:00 via INTRAVENOUS

## 2014-07-17 MED ORDER — FUROSEMIDE 10 MG/ML IJ SOLN
60.0000 mg | Freq: Once | INTRAMUSCULAR | Status: AC
  Administered 2014-07-17: 60 mg via INTRAVENOUS
  Filled 2014-07-17: qty 6

## 2014-07-17 MED ORDER — FUROSEMIDE 10 MG/ML IJ SOLN
20.0000 mg | Freq: Once | INTRAMUSCULAR | Status: AC
  Administered 2014-07-17: 20 mg via INTRAVENOUS
  Filled 2014-07-17: qty 2

## 2014-07-17 NOTE — Progress Notes (Signed)
Subjective: Denies CP   No SOB at rest   Objective: Filed Vitals:   07/17/14 1142 07/17/14 1145 07/17/14 1200 07/17/14 1300  BP: 121/51 124/52 127/52 126/56  Pulse: 80 82 82 82  Temp: 98.3 F (36.8 C)  98.5 F (36.9 C) 98.5 F (36.9 C)  TempSrc: Oral  Oral   Resp: Height:      Weight:      SpO2: 94% 95% 95% 97%   Weight change:   Intake/Output Summary (Last 24 hours) at 07/17/14 1339 Last data filed at 07/17/14 1300  Gross per 24 hour  Intake    600 ml  Output   1550 ml  Net   -950 ml    General: Alert, awake, oriented x3, in no acute distress Neck:  JVP is diff to assess  Heart: Regular rate and rhythm, without murmurs, rubs, gallops.  Lungs: Clear to auscultation.  Crackles at R base    Exemities:  No edema.   Neuro: Grossly intact, nonfocal.  Tele:  SR    Lab Results: Results for orders placed or performed during the hospital encounter of 07/15/14 (from the past 24 hour(s))  Glucose, capillary     Status: None   Collection Time: 07/16/14  4:43 PM  Result Value Ref Range   Glucose-Capillary 75 65 - 99 mg/dL  Glucose, capillary     Status: None   Collection Time: 07/16/14  9:29 PM  Result Value Ref Range   Glucose-Capillary 95 65 - 99 mg/dL  Lipase, blood     Status: Abnormal   Collection Time: 07/17/14  4:24 AM  Result Value Ref Range   Lipase 703 (H) 22 - 51 U/L  Comprehensive metabolic panel     Status: Abnormal   Collection Time: 07/17/14  4:24 AM  Result Value Ref Range   Sodium 135 135 - 145 mmol/L   Potassium 4.3 3.5 - 5.1 mmol/L   Chloride 106 101 - 111 mmol/L   CO2 22 22 - 32 mmol/L   Glucose, Bld 94 65 - 99 mg/dL   BUN 26 (H) 6 - 20 mg/dL   Creatinine, Ser 1.61 (H) 0.61 - 1.24 mg/dL   Calcium 7.3 (L) 8.9 - 10.3 mg/dL   Total Protein 4.9 (L) 6.5 - 8.1 g/dL   Albumin 2.3 (L) 3.5 - 5.0 g/dL   AST 096 (H) 15 - 41 U/L   ALT 119 (H) 17 - 63 U/L   Alkaline Phosphatase 90 38 - 126 U/L   Total Bilirubin 0.5 0.3 - 1.2 mg/dL   GFR  calc non Af Amer 26 (L) >60 mL/min   GFR calc Af Amer 30 (L) >60 mL/min   Anion gap 7 5 - 15  CBC     Status: Abnormal   Collection Time: 07/17/14  4:24 AM  Result Value Ref Range   WBC 19.0 (H) 4.0 - 10.5 K/uL   RBC 2.81 (L) 4.22 - 5.81 MIL/uL   Hemoglobin 8.4 (L) 13.0 - 17.0 g/dL   HCT 04.5 (L) 40.9 - 81.1 %   MCV 88.3 78.0 - 100.0 fL   MCH 29.9 26.0 - 34.0 pg   MCHC 33.9 30.0 - 36.0 g/dL   RDW 91.4 78.2 - 95.6 %   Platelets 205 150 - 400 K/uL  Glucose, capillary     Status: Abnormal   Collection Time: 07/17/14  7:58 AM  Result Value Ref Range   Glucose-Capillary 100 (H) 65 - 99 mg/dL  Comment 1 Notify RN    Comment 2 Document in Chart   Prepare RBC     Status: None   Collection Time: 07/17/14 10:12 AM  Result Value Ref Range   Order Confirmation      ORDER PROCESSED BY BLOOD BANK BB SAMPLE OR UNITS ALREADY AVAILABLE  Glucose, capillary     Status: Abnormal   Collection Time: 07/17/14 11:38 AM  Result Value Ref Range   Glucose-Capillary 101 (H) 65 - 99 mg/dL    Studies/Results: Dg Chest Port 1 View  07/17/2014   CLINICAL DATA:  Shortness of Breath  EXAM: PORTABLE CHEST - 1 VIEW  COMPARISON:  July 16, 2014  FINDINGS: Central catheter tip is in the superior vena cava. No pneumothorax. There is widespread fibrotic type change in the lungs. There may well be superimposed interstitial edema. There is no airspace consolidation. Heart is upper normal in size with evidence of a degree of pulmonary venous hypertension. No adenopathy appreciable.  IMPRESSION: The appearance is indicative of a degree of congestive heart failure superimposed on chronic interstitial fibrosis. No airspace consolidation. No pneumothorax.   Electronically Signed   By: Bretta Bang III M.D.   On: 07/17/2014 07:40    Medications: REviewed  @PROBHOSP @  1  Atrial tachycardia  No recurrence  Keep on low dose dilitiazem  Echo pending   Note CXR with some pulmonary edema  Given IV lasix 20 mg today  without signif response I would increase to 60 mg once and follow output as well as renal function.     LOS: 1 day   Dietrich Pates 07/17/2014, 1:39 PM

## 2014-07-17 NOTE — Progress Notes (Signed)
Per MD note pt may possibly have Lap chole with IOC as early as 7/15. This RN did not remove foley or IJ port. Will continue to monitor pt.

## 2014-07-17 NOTE — Progress Notes (Signed)
Patient ID: Christian Dominguez, male   DOB: 09-06-1921, 81 y.o.   MRN: [id number]  Requiring more oxygen this morning CXR with chronic findings that could be hiding some CHF Will have to hold on lap chole today Will start liquids and make NPO at midnight in case he improves by tomorrow He would like to go ahead and have his lap chole this admission given how symptomatic he has been with the gallbladder for some time now.

## 2014-07-17 NOTE — Progress Notes (Signed)
[facility] Progress Note  Subjective  - POD #2  Wants to get out of bed into chair. Denies abdominal pain, nausea and shortness of breath.   Objective Filed Vitals:   07/17/14 0900  BP:   Pulse:   Temp: 97.5 F (36.4 C)  Resp:    Tmax 99.2 BP sys 100s-150s 02 92% [place] 8L/min, Venturi 40%   Intake/Output Summary (Last 24 hours) at 07/17/14 0941 Last data filed at 07/17/14 0900  Gross per 24 hour  Intake    120 ml  Output   1550 ml  Net  -1430 ml   General: resting in bed in NAD Lungs: diffuse crackles, tachypneic  CV: regular rate and rhythm Abd: Soft non tender to palpation. No distension. Incisions: b/ groins soft without hematoma. Left inguinal hernia is soft.  Extremities: palpable dorsalis pedis pulses bilaterally   Assessment/Planning: 81 y.o. male is s/p: EVAR for symptomatic AAA without rupture and hypotension, gallstone pancreatitis, SVT 2 Days Post-Op   -AAA s/p EVAR: stable post-op -SOB: diffuse crackles on exam. 02 sats 92% on 8L [place] and 40% Venturi mask. CXR this am showing CHF and pulmonary fibrosis. Slightly worse than yesterday. Will gently diurese given BP sys 100s. Lasix 20 mg IV x 1. Will consult pulmonary for assistance.  -Gallstone pancreatitis: Lipase and LFTs improving. Originally scheduled for lap chole today but on hold due to respiratory status. Per general surgery.  -ABLA: Hgb 8.4 today. Will give 1 unit of pRBCs to improve coronary oxygenation.  -ARF: Creatinine continuing to trend downwards. IVF discontinued yesterday due to concerns for volume overload.  -SVT: per cardiology -Appreciate cardiology and general surgery following.  -Keep in 3S    Christian Dominguez 07/17/2014 9:41 AM -- Christian Dominguez looks ok but hypoxia requiring more O2 probably multifactorial with hypervolemia and pulmonary fibrosis Abdominal pain improved lipase and LFTs trending the right direction as well as leukocytosis Renal function so far has been  stable. Agree with some diuresis. Appreciate Cardiology input Will get pulmonary consult as well Hopefully tune up his pulmonary function enough for lap chole Seems to be doing all right from aneurysm standpoint Acute blood loss and chronic anemia with hypoxia will transfuse Rbc  Fabienne Bruns, MD [facility] Office: [phone] Pager: [phone]  Laboratory CBC    Component Value Date/Time   WBC 19.0* 07/17/2014 0424   HGB 8.4* 07/17/2014 0424   HCT 24.8* 07/17/2014 0424   PLT 205 07/17/2014 0424    BMET    Component Value Date/Time   NA 135 07/17/2014 0424   K 4.3 07/17/2014 0424   CL 106 07/17/2014 0424   CO2 22 07/17/2014 0424   GLUCOSE 94 07/17/2014 0424   BUN 26* 07/17/2014 0424   CREATININE 2.09* 07/17/2014 0424   CREATININE 2.25* 07/13/2014 1036   CALCIUM 7.3* 07/17/2014 0424   GFRNONAA 26* 07/17/2014 0424   GFRAA 30* 07/17/2014 0424    COAG Lab Results  Component Value Date   INR 1.21 07/16/2014   No results found for: PTT  Antibiotics Anti-infectives    Start     Dose/Rate Route Frequency Ordered Stop   07/16/14 0800  cefUROXime (ZINACEF) 1.5 g in dextrose 5 % 50 mL IVPB     1.5 g 100 mL/hr over 30 Minutes Intravenous Every 12 hours 07/16/14 0041 07/16/14 2041       Maris Berger, PA-C [facility] Office: [phone] Pager: [phone] 07/17/2014 9:41 AM

## 2014-07-17 NOTE — Progress Notes (Signed)
  Echocardiogram 2D Echocardiogram has been performed.  Christian Dominguez 07/17/2014, 3:46 PM

## 2014-07-17 NOTE — Progress Notes (Signed)
2 Days Post-Op  Subjective: Had some SOB overnight but denies it this morning Denies abdominal pain  Objective: Vital signs in last 24 hours: Temp:  [97.5 F (36.4 C)-99.2 F (37.3 C)] 99.2 F (37.3 C) (07/15 0400) Pulse Rate:  [63-81] 75 (07/15 0400) Resp:  [19-28] 28 (07/15 0400) BP: (92-117)/(41-50) 108/50 mmHg (07/15 0400) SpO2:  [90 %-95 %] 92 % (07/15 0400) Arterial Line BP: (107-150)/(38-50) 108/42 mmHg (07/15 0400) FiO2 (%):  [35 %-40 %] 40 % (07/15 0400)    Intake/Output from previous day: 07/14 0701 - 07/15 0700 In: 290 [P.O.:240; IV Piggyback:50] Out: 1650 [Urine:1650] Intake/Output this shift:    Abdomen soft with minimal guarding Lungs with bilateral crackles  Lab Results:   Recent Labs  07/16/14 0510 07/17/14 0424  WBC 24.9* 19.0*  HGB 8.8* 8.4*  HCT 26.3* 24.8*  PLT 229 205   BMET  Recent Labs  07/16/14 0530 07/17/14 0424  NA 135 135  K 4.3 4.3  CL 103 106  CO2 24 22  GLUCOSE 161* 94  BUN 30* 26*  CREATININE 2.38* 2.09*  CALCIUM 7.2* 7.3*   PT/INR  Recent Labs  07/16/14 0048  LABPROT 15.5*  INR 1.21   ABG  Recent Labs  07/15/14 2152  PHART 7.317*  HCO3 21.8    Studies/Results: Dg Chest Port 1 View  07/16/2014   CLINICAL DATA:  Status post abdominal aortic aneurysm is arm repair ; history of diabetes.  EXAM: PORTABLE CHEST - 1 VIEW  COMPARISON:  Portable chest x-ray of July 16, 2014  FINDINGS: There remains elevation of the right hemidiaphragm. The interstitial markings of both lungs remain diffusely increased but may have slightly improved since yesterday's study. The cardiac silhouette remains enlarged. The pulmonary vascularity is less engorged. The right internal jugular Cordis sheath tip projects over the proximal portion of the SVC. There is no pleural effusion or pneumothorax. The observed bony thorax is unremarkable.  IMPRESSION: Slight interval improvement in CHF and bilateral pulmonary interstitial edema. Underlying  pulmonary fibrotic changes.   Electronically Signed   By: David  [place] M.D.   On: 07/16/2014 07:58   Dg Chest Port 1 View  07/16/2014   CLINICAL DATA:  Central line placement  EXAM: PORTABLE CHEST - 1 VIEW  COMPARISON:  None.  FINDINGS: Right central venous catheter with tip projecting over the low SVC. No pneumothorax. Normal heart size. Low lung volumes. Diffuse interstitial pattern to both lungs. Correlation with prior CT abdomen and pelvis demonstrates this to represent diffuse parenchymal fibrosis with cystic honeycomb changes. No blunting of costophrenic angles. No pneumothorax.  IMPRESSION: Right central venous catheter appears to be in satisfactory position. Diffuse coarse interstitial infiltration throughout both lungs with low lung volumes consistent with chronic fibrosis.   Electronically Signed   By: Burman Nieves M.D.   On: 07/16/2014 01:28   Dg Abd Portable 1v  07/16/2014   CLINICAL DATA:  Post abdominal aortic aneurysm surgery.  EXAM: PORTABLE ABDOMEN - 1 VIEW  COMPARISON:  CT abdomen and pelvis 07/15/2014  FINDINGS: Aorto bi-iliac stent graft is present. Bowel gas pattern is normal. Coarse infiltrates in the lung bases suggesting fibrosis.  IMPRESSION: Aorto bi-iliac stent graft. Normal bowel gas pattern. Fibrosis in the lung bases.   Electronically Signed   By: Burman Nieves M.D.   On: 07/16/2014 04:07   Ct Angio Abd/pel W/ And/or W/o  07/15/2014   CLINICAL DATA:  81 year old male with generalized abdominal pain, nausea and vomiting. History of gallstone  and abdominal aortic aneurysm.  EXAM: CTA ABDOMEN AND PELVIS wITHOUT AND WITH CONTRAST  TECHNIQUE: Multidetector CT imaging of the abdomen and pelvis was performed using the standard protocol during bolus administration of intravenous contrast. Multiplanar reconstructed images and MIPs were obtained and reviewed to evaluate the vascular anatomy.  CONTRAST:  OMNIPAQUE IOHEXOL 350 MG/ML SOLN  COMPARISON:  None.  FINDINGS: There  are emphysematous changes with diffuse ground-glass appearance of the visualized lung bases. There is coronary vascular calcification. Top-normal cardiac size.  No intra-abdominal free air no free fluid.  There is focal heterogeneous uterine enhancement in the right lobe of the liver adjacent to the gallbladder. No definite lesion of identified. Subcentimeter scattered hypodense lesions are too small to characterize. There are multiple stones within the gallbladder. Trace pericholecystic fluid may be present. The common bile duct is normal in caliber. There is diffuse inflammatory changes and edema of the pancreas compatible with pancreatitis. No drainable fluid collection/abscess or pseudocyst identified. The spleen demonstrates a lobulated contour otherwise unremarkable. The adrenal glands appear unremarkable. The right kidney appears unremarkable. There is a focal area of hypoenhancement involving the inferior pole of the left kidney concerning for a focal hypo profusion/infarct. A small accessory renal artery is noted extending from the distal aorta inferior to the aneurysm into the inferior pole of the left kidney. There is apparent hypo enhancement of the origin of this accessory artery (series 401, image 139 and coronal series 46, image 47) which may account for hypoperfusion of this portion of the left kidney. There is no hydronephrosis on either side. There is apparent diffuse thickening of the bladder wall which may be partly related to underdistention. Cystitis is not excluded. Correlation with urinalysis recommended. The prostate gland and seminal vesicles are grossly unremarkable.  Small hiatal hernia. There is apparent thickening of the stomach and duodenum, likely related to inflammatory changes of the pancreas. There is a 4.4 x 2.7 cm duodenal diverticulum. Inflammatory changes surrounding the diverticulum are likely related to pancreatitis and less likely represent diverticulitis of the duodenum.  There is extensive sigmoid and colonic diverticulosis without active inflammatory changes. No evidence of bowel obstruction. Normal appendix.  There is aortoiliac atherosclerotic disease. The aorta is tortuous. There is an infrarenal abdominal aortic aneurysm measuring up to 6.9 cm in greatest axial dimension. This aneurysm starts approximately 4.2 cm inferior to the origins of the left renal artery and extends to the level of the bifurcation of the aorta. No definite extravasation of the contrast identified outside of the confines of the lumen of the aneurysm. Mild haziness of the periaortic fat is likely related to inflammatory changes extending from the pancreas. There is focal narrowing of the origin of the SMA with approximately 40- 50% luminal narrowing. There is atherosclerotic calcification of the ostia of the renal arteries. The origins of the celiac axis, IMA as well as the origins of the renal arteries remain patent. There is no lymphadenopathy. The visualized IVC appears unremarkable. No portal venous gas identified. The splenic vein is patent.  Fat containing left inguinal hernia without evidence of inflammation or incarceration. Degenerative changes of the spine. No acute fracture.  Review of the MIP images confirms the above findings.  IMPRESSION: Pancreatitis possibly related to gallstone. No definite stone identified in the central CBD.  Infrarenal abdominal aortic aneurysm. No evidence of extravasation of contrast identified.  Cholelithiasis.  Focal hypoenhancement of the inferior aspect of the left kidney concerning for infarct. A small accessory artery appears to  extend from the inferior aorta to the lower pole of the left kidney.  The duodenal diverticulum.  Extensive colonic diverticulosis.  Critical Value/emergent results were called by telephone at the time of interpretation on 07/15/2014 at 8:59 pm to Dr. Derinda Sis , who verbally acknowledged these results.   Electronically Signed   By:  Elgie Collard M.D.   On: 07/15/2014 21:50    Anti-infectives: Anti-infectives    Start     Dose/Rate Route Frequency Ordered Stop   07/16/14 0800  cefUROXime (ZINACEF) 1.5 g in dextrose 5 % 50 mL IVPB     1.5 g 100 mL/hr over 30 Minutes Intravenous Every 12 hours 07/16/14 0041 07/16/14 2041      Assessment/Plan: s/p Procedure(s): ABDOMINAL AORTIC ENDOVASCULAR STENT GRAFT (N/A)  Gallstone pancreatitis  Given lung exam and increased resp rate, will check a pCXR.  Will hold on surgery for now and then make a decision after the film. Continue NPO for now LFTs and lipase improved  LOS: 1 day    Christian Dominguez A 07/17/2014

## 2014-07-17 NOTE — Consult Note (Addendum)
Name: Christian Dominguez MRN: [id number] DOB: Oct 14, 1921    ADMISSION DATE:  07/15/2014 CONSULTATION DATE:  07/17/2014  REFERRING MD :  Dr. Darrick Penna  CHIEF COMPLAINT:  Hypoxia  BRIEF PATIENT DESCRIPTION: 81yo M admitted with AAA repair and was found have gal stone pancreatitis, while in the hospital was noted to be desaturating and PCCM was consulted.  Patient has complained of SOB for years but has sought no medical attention and has not had his O2 sat checked, does not see doctors.  CT of the abdomen done in the hospital, pulmonary windows showed severe and chronic fibrosis.  SIGNIFICANT EVENTS  7/12: Went for CTA abd/pelv to assess AAA and found to have Cr of 2.25, BUN 41, recommend admit to hospital 7/13: ED for abdominal pain, N, V - CTA done. Surgery - EVAR for symptomatic AAA.  7/14: 1L of fluid overnight for low BP, awoke with increased hypoxia 7/15: PCCM  STUDIES:  CT abd: burned out fibrosis that I reviewed myself. CXR: evidence of some minor pulmonary edema but fibrosis present.  HISTORY OF PRESENT ILLNESS:  As above.  PAST MEDICAL HISTORY :   has a past medical history of Calculus of gallbladder with cholecystitis; AAA (abdominal aortic aneurysm); Arthritis; Diabetes mellitus without complication; Esophageal reflux; Hearing loss of aging; Hyperlipidemia; and Thyroid disease.  has past surgical history that includes Abdominal aortic endovascular stent graft (N/A, 07/15/2014). Prior to Admission medications   Medication Sig Start Date End Date Taking? Authorizing Provider  albuterol (PROVENTIL HFA;VENTOLIN HFA) 108 (90 BASE) MCG/ACT inhaler Inhale into the lungs every 6 (six) hours as needed for wheezing or shortness of breath.   Yes Historical Provider, MD  amLODipine (NORVASC) 10 MG tablet Take 10 mg by mouth at bedtime.    Yes Historical Provider, MD  levothyroxine (LEVOTHROID) 50 MCG tablet Take 50 mcg by mouth daily before breakfast.   Yes Historical Provider, MD    losartan-hydrochlorothiazide (HYZAAR) 100-12.5 MG per tablet Take 1 tablet by mouth daily.   Yes Historical Provider, MD  metFORMIN (GLUCOPHAGE) 1000 MG tablet Take 1,000 mg by mouth 2 (two) times daily with a meal.   Yes Historical Provider, MD  omeprazole (PRILOSEC) 40 MG capsule Take 40 mg by mouth daily.   Yes Historical Provider, MD  ondansetron (ZOFRAN) 4 MG tablet Take 4 mg by mouth every 8 (eight) hours as needed for nausea or vomiting.   Yes Historical Provider, MD  pravastatin (PRAVACHOL) 40 MG tablet Take 40 mg by mouth at bedtime.    Yes Historical Provider, MD   No Known Allergies  FAMILY HISTORY:  family history includes Heart disease in his father. SOCIAL HISTORY:  reports that he has never smoked. He does not have any smokeless tobacco history on file. He reports that he does not drink alcohol or use illicit drugs.  REVIEW OF SYSTEMS:   Constitutional: Negative for fever, chills, weight loss, malaise/fatigue and diaphoresis.  HEENT: Negative for hearing loss, ear pain, nosebleeds, congestion, sore throat, neck pain, tinnitus and ear discharge.   Eyes: Negative for blurred vision, double vision, photophobia, pain, discharge and redness.  Respiratory: Negative for cough, hemoptysis, sputum production, wheezing and stridor, but positive shortness of breath.   Cardiovascular: Negative for chest pain, palpitations, orthopnea, claudication, leg swelling and PND.  Gastrointestinal: Negative for heartburn, nausea, vomiting, abdominal pain, diarrhea, constipation, blood in stool and melena.  Genitourinary: Negative for dysuria, urgency, frequency, hematuria and flank pain.  Musculoskeletal: Negative for myalgias, back pain, joint pain and  falls.  Skin: Negative for itching and rash.  Neurological: Negative for dizziness, tingling, tremors, sensory change, speech change, focal weakness, seizures, loss of consciousness, weakness and headaches.  Endo/Heme/Allergies: Negative for  environmental allergies and polydipsia. Does not bruise/bleed easily.  SUBJECTIVE: DOE only.  VITAL SIGNS: Temp:  [97.5 F (36.4 C)-99.2 F (37.3 C)] 97.5 F (36.4 C) (07/15 0900) Pulse Rate:  [65-81] 75 (07/15 0400) Resp:  [21-28] 28 (07/15 0400) BP: (92-117)/(41-50) 108/50 mmHg (07/15 0400) SpO2:  [90 %-95 %] 92 % (07/15 0400) Arterial Line BP: (107-150)/(38-50) 108/42 mmHg (07/15 0400) FiO2 (%):  [40 %] 40 % (07/15 0400)  PHYSICAL EXAMINATION: General:  Elderly appearing male, NAD on 8L [place]. Neuro:  Alert and interactive, moving all ext to command. Head: [place]/AT. EENT:  PERRL, EOM-I and MMM. Cardiovascular:  RRR, Nl S1/S2, -M/R/G. Lungs:  Diffuse Velcro crackles. Abdomen:  Soft, NT, ND and +BS. Musculoskeletal:  -edema and -tenderness. Skin:  Thin but intact.   Recent Labs Lab 07/16/14 0048 07/16/14 0530 07/17/14 0424  NA 130* 135 135  K 4.1 4.3 4.3  CL 99* 103 106  CO2 BUN 33* 30* 26*  CREATININE 2.68* 2.38* 2.09*  GLUCOSE 259* 161* 94    Recent Labs Lab 07/16/14 0048 07/16/14 0510 07/17/14 0424  HGB 9.7* 8.8* 8.4*  HCT 28.7* 26.3* 24.8*  WBC 33.1* 24.9* 19.0*  PLT 242 229 205   Dg Chest Port 1 View  07/17/2014   CLINICAL DATA:  Shortness of Breath  EXAM: PORTABLE CHEST - 1 VIEW  COMPARISON:  July 16, 2014  FINDINGS: Central catheter tip is in the superior vena cava. No pneumothorax. There is widespread fibrotic type change in the lungs. There may well be superimposed interstitial edema. There is no airspace consolidation. Heart is upper normal in size with evidence of a degree of pulmonary venous hypertension. No adenopathy appreciable.  IMPRESSION: The appearance is indicative of a degree of congestive heart failure superimposed on chronic interstitial fibrosis. No airspace consolidation. No pneumothorax.   Electronically Signed   By: Bretta Bang III M.D.   On: 07/17/2014 07:40   Dg Chest Port 1 View  07/16/2014   CLINICAL DATA:  Status post  abdominal aortic aneurysm is arm repair ; history of diabetes.  EXAM: PORTABLE CHEST - 1 VIEW  COMPARISON:  Portable chest x-ray of July 16, 2014  FINDINGS: There remains elevation of the right hemidiaphragm. The interstitial markings of both lungs remain diffusely increased but may have slightly improved since yesterday's study. The cardiac silhouette remains enlarged. The pulmonary vascularity is less engorged. The right internal jugular Cordis sheath tip projects over the proximal portion of the SVC. There is no pleural effusion or pneumothorax. The observed bony thorax is unremarkable.  IMPRESSION: Slight interval improvement in CHF and bilateral pulmonary interstitial edema. Underlying pulmonary fibrotic changes.   Electronically Signed   By: David  [place] M.D.   On: 07/16/2014 07:58   Dg Chest Port 1 View  07/16/2014   CLINICAL DATA:  Central line placement  EXAM: PORTABLE CHEST - 1 VIEW  COMPARISON:  None.  FINDINGS: Right central venous catheter with tip projecting over the low SVC. No pneumothorax. Normal heart size. Low lung volumes. Diffuse interstitial pattern to both lungs. Correlation with prior CT abdomen and pelvis demonstrates this to represent diffuse parenchymal fibrosis with cystic honeycomb changes. No blunting of costophrenic angles. No pneumothorax.  IMPRESSION: Right central venous catheter appears to be in satisfactory position. Diffuse  coarse interstitial infiltration throughout both lungs with low lung volumes consistent with chronic fibrosis.   Electronically Signed   By: Burman Nieves M.D.   On: 07/16/2014 01:28   Dg Abd Portable 1v  07/16/2014   CLINICAL DATA:  Post abdominal aortic aneurysm surgery.  EXAM: PORTABLE ABDOMEN - 1 VIEW  COMPARISON:  CT abdomen and pelvis 07/15/2014  FINDINGS: Aorto bi-iliac stent graft is present. Bowel gas pattern is normal. Coarse infiltrates in the lung bases suggesting fibrosis.  IMPRESSION: Aorto bi-iliac stent graft. Normal bowel gas  pattern. Fibrosis in the lung bases.   Electronically Signed   By: Burman Nieves M.D.   On: 07/16/2014 04:07   Ct Angio Abd/pel W/ And/or W/o  07/15/2014   CLINICAL DATA:  81 year old male with generalized abdominal pain, nausea and vomiting. History of gallstone and abdominal aortic aneurysm.  EXAM: CTA ABDOMEN AND PELVIS wITHOUT AND WITH CONTRAST  TECHNIQUE: Multidetector CT imaging of the abdomen and pelvis was performed using the standard protocol during bolus administration of intravenous contrast. Multiplanar reconstructed images and MIPs were obtained and reviewed to evaluate the vascular anatomy.  CONTRAST:  OMNIPAQUE IOHEXOL 350 MG/ML SOLN  COMPARISON:  None.  FINDINGS: There are emphysematous changes with diffuse ground-glass appearance of the visualized lung bases. There is coronary vascular calcification. Top-normal cardiac size.  No intra-abdominal free air no free fluid.  There is focal heterogeneous uterine enhancement in the right lobe of the liver adjacent to the gallbladder. No definite lesion of identified. Subcentimeter scattered hypodense lesions are too small to characterize. There are multiple stones within the gallbladder. Trace pericholecystic fluid may be present. The common bile duct is normal in caliber. There is diffuse inflammatory changes and edema of the pancreas compatible with pancreatitis. No drainable fluid collection/abscess or pseudocyst identified. The spleen demonstrates a lobulated contour otherwise unremarkable. The adrenal glands appear unremarkable. The right kidney appears unremarkable. There is a focal area of hypoenhancement involving the inferior pole of the left kidney concerning for a focal hypo profusion/infarct. A small accessory renal artery is noted extending from the distal aorta inferior to the aneurysm into the inferior pole of the left kidney. There is apparent hypo enhancement of the origin of this accessory artery (series 401, image 139 and  coronal series 46, image 47) which may account for hypoperfusion of this portion of the left kidney. There is no hydronephrosis on either side. There is apparent diffuse thickening of the bladder wall which may be partly related to underdistention. Cystitis is not excluded. Correlation with urinalysis recommended. The prostate gland and seminal vesicles are grossly unremarkable.  Small hiatal hernia. There is apparent thickening of the stomach and duodenum, likely related to inflammatory changes of the pancreas. There is a 4.4 x 2.7 cm duodenal diverticulum. Inflammatory changes surrounding the diverticulum are likely related to pancreatitis and less likely represent diverticulitis of the duodenum. There is extensive sigmoid and colonic diverticulosis without active inflammatory changes. No evidence of bowel obstruction. Normal appendix.  There is aortoiliac atherosclerotic disease. The aorta is tortuous. There is an infrarenal abdominal aortic aneurysm measuring up to 6.9 cm in greatest axial dimension. This aneurysm starts approximately 4.2 cm inferior to the origins of the left renal artery and extends to the level of the bifurcation of the aorta. No definite extravasation of the contrast identified outside of the confines of the lumen of the aneurysm. Mild haziness of the periaortic fat is likely related to inflammatory changes extending from the pancreas. There  is focal narrowing of the origin of the SMA with approximately 40- 50% luminal narrowing. There is atherosclerotic calcification of the ostia of the renal arteries. The origins of the celiac axis, IMA as well as the origins of the renal arteries remain patent. There is no lymphadenopathy. The visualized IVC appears unremarkable. No portal venous gas identified. The splenic vein is patent.  Fat containing left inguinal hernia without evidence of inflammation or incarceration. Degenerative changes of the spine. No acute fracture.  Review of the MIP images  confirms the above findings.  IMPRESSION: Pancreatitis possibly related to gallstone. No definite stone identified in the central CBD.  Infrarenal abdominal aortic aneurysm. No evidence of extravasation of contrast identified.  Cholelithiasis.  Focal hypoenhancement of the inferior aspect of the left kidney concerning for infarct. A small accessory artery appears to extend from the inferior aorta to the lower pole of the left kidney.  The duodenal diverticulum.  Extensive colonic diverticulosis.  Critical Value/emergent results were called by telephone at the time of interpretation on 07/15/2014 at 8:59 pm to Dr. Derinda Sis , who verbally acknowledged these results.   Electronically Signed   By: Elgie Collard M.D.   On: 07/15/2014 21:50   Discussed with patient's grand daughter who is a PA and with primary service.  ASSESSMENT / PLAN:  81 year old male with likely IPF vs occupational exposure.    Acute Hypoxic Respiratory Failure - likely to newly found but likely present for years, pulmonary fibrosis that we are just seeing now.  The patient does not seek medical attention.  - No steroids for now give surgical concerns and pancreatitis.  - O2 as needed.  - Highly unlikely to be PE given hemodynamics.  - Will need an ambulatory desaturation study prior to discharge to get home O2.  Suspected pulmonary fibrosis  - Will need a chest CT without contrast with HRCT to determine extent of fibrosis.  - Will arrange pulmonary F/U as outpatient.  Pulmonary edema:  - Continue diureses as renal function and hemodynamics allow.  - F/U on 2D echo.  Code status: discussed with patient at length, requesting full DNR status outside of the surgical realm.  PCCM will follow with you.  Alyson Reedy, M.D. [facility] Pulmonary/Critical Care Medicine. Pager: [phone]. After hours pager: [phone].  07/17/2014, 11:23 AM

## 2014-07-17 NOTE — Progress Notes (Signed)
Spoke with RN regarding pt's oxygenation. Pt had been on 4lpm [place] 02, and currently requires 35% 02 via Venti Mask. Pt seemed slightly reluctant to wear VM, and desats when becoming agitated. Spoke with family and RN in room, and suggested that he continue to wear it.  Pt presents with bilateral crackles in all fields, suggesting possible fluid issues. Pt currently comfortable at the moment, and VS WNL. Will continue to monitor and assess as necessary.

## 2014-07-18 DIAGNOSIS — I5032 Chronic diastolic (congestive) heart failure: Secondary | ICD-10-CM

## 2014-07-18 DIAGNOSIS — N183 Chronic kidney disease, stage 3 (moderate): Secondary | ICD-10-CM

## 2014-07-18 LAB — CBC
HCT: 30.2 % — ABNORMAL LOW (ref 39.0–52.0)
HEMOGLOBIN: 10.3 g/dL — AB (ref 13.0–17.0)
MCH: 29.6 pg (ref 26.0–34.0)
MCHC: 34.1 g/dL (ref 30.0–36.0)
MCV: 86.8 fL (ref 78.0–100.0)
PLATELETS: 223 10*3/uL (ref 150–400)
RBC: 3.48 MIL/uL — ABNORMAL LOW (ref 4.22–5.81)
RDW: 14.5 % (ref 11.5–15.5)
WBC: 19.5 10*3/uL — ABNORMAL HIGH (ref 4.0–10.5)

## 2014-07-18 LAB — BASIC METABOLIC PANEL
ANION GAP: 8 (ref 5–15)
BUN: 30 mg/dL — ABNORMAL HIGH (ref 6–20)
CALCIUM: 7.9 mg/dL — AB (ref 8.9–10.3)
CO2: 27 mmol/L (ref 22–32)
Chloride: 98 mmol/L — ABNORMAL LOW (ref 101–111)
Creatinine, Ser: 2.1 mg/dL — ABNORMAL HIGH (ref 0.61–1.24)
GFR calc Af Amer: 30 mL/min — ABNORMAL LOW (ref >60)
GFR, EST NON AFRICAN AMERICAN: 26 mL/min — AB (ref >60)
Glucose, Bld: 120 mg/dL — ABNORMAL HIGH (ref 65–99)
POTASSIUM: 3.7 mmol/L (ref 3.5–5.1)
Sodium: 133 mmol/L — ABNORMAL LOW (ref 135–145)

## 2014-07-18 LAB — LIPASE, BLOOD: Lipase: 60 U/L — ABNORMAL HIGH (ref 22–51)

## 2014-07-18 LAB — GLUCOSE, CAPILLARY
GLUCOSE-CAPILLARY: 89 mg/dL (ref 65–99)
Glucose-Capillary: 131 mg/dL — ABNORMAL HIGH (ref 65–99)
Glucose-Capillary: 118 mg/dL — ABNORMAL HIGH (ref 65–99)
Glucose-Capillary: 163 mg/dL — ABNORMAL HIGH (ref 65–99)

## 2014-07-18 MED ORDER — METOPROLOL TARTRATE 12.5 MG HALF TABLET
12.5000 mg | ORAL_TABLET | Freq: Two times a day (BID) | ORAL | Status: DC
  Administered 2014-07-18 – 2014-07-19 (×3): 12.5 mg via ORAL
  Filled 2014-07-18 (×4): qty 1

## 2014-07-18 NOTE — Progress Notes (Addendum)
  [facility] Progress Note  Subjective  - POD #3  Feels "sore all over" and weak. Having shortness of breath but says he has had it for a "long time." Denies abdominal pain, nausea, chest pain, chest pressure and dizziness.   Objective Filed Vitals:   07/18/14 0537  BP: 112/64  Pulse:   Temp:   Resp:   BP 90s-130s 02 95% 8L/min Venturi mask 40%   Intake/Output Summary (Last 24 hours) at 07/18/14 4627 Last data filed at 07/18/14 0407  Gross per 24 hour  Intake    600 ml  Output   3525 ml  Net  -2925 ml   Sitting in chair in NAD Pulm: bibasilar crackles, clearer mid and upper fields b/l, tachypneic Card: tachycardic, fluctuates with sinus tachy and irregular Abdomen: soft, non tender, no distension Incisions: b/l groins soft without hematoma Extremities: palpable dorsalis pedis pulses b/l   Assessment/Planning: 81 y.o. male is s/p: endovascular repair of symptomatic AAA without rupture, acute hypoxic respiratory failure,  gallstone pancreatitis, SVT, ARF  3 Days Post-Op   AAA s/p EVAR: stable  Acute hypoxic respiratory failure: received 80 mg lasix yesterday but still with requiring venti mask and 02. Underlying pulmonary fibrosis. Reluctant to continue to diurese given soft BP. SVT: patient fluctuating between HR in 130s and then 80s with intermittent segments of irregular rhythm. Asymptomatic. On cardizem. Per cardiology. Gallstone pancreatitis: surgery on hold due to respiratory issues. Per general surgery.  ARF: creatinine trending down. IVF on hold due to concerns for volume overload  ABLA: Hgb improved after one unit pRBCs yesterday.  Appreciate cardiology, pulmonology and general surgery following.  Keep in 3S  Christian Dominguez 07/18/2014 8:12 AM --  Laboratory CBC    Component Value Date/Time   WBC 19.5* 07/18/2014 0538   HGB 10.3* 07/18/2014 0538   HCT 30.2* 07/18/2014 0538   PLT 223 07/18/2014 0538    BMET    Component Value  Date/Time   NA 133* 07/18/2014 0538   K 3.7 07/18/2014 0538   CL 98* 07/18/2014 0538   CO2 27 07/18/2014 0538   GLUCOSE 120* 07/18/2014 0538   BUN 30* 07/18/2014 0538   CREATININE 2.10* 07/18/2014 0538   CREATININE 2.25* 07/13/2014 1036   CALCIUM 7.9* 07/18/2014 0538   GFRNONAA 26* 07/18/2014 0538   GFRAA 30* 07/18/2014 0538    COAG Lab Results  Component Value Date   INR 1.21 07/16/2014   No results found for: PTT  Antibiotics Anti-infectives    Start     Dose/Rate Route Frequency Ordered Stop   07/16/14 0800  cefUROXime (ZINACEF) 1.5 g in dextrose 5 % 50 mL IVPB     1.5 g 100 mL/hr over 30 Minutes Intravenous Every 12 hours 07/16/14 0041 07/16/14 2041       Christian Berger, PA-C [facility] Office: [phone] Pager: [phone] 07/18/2014 8:12 AM  Agree with above. Doing well s/p EVAR.  Issues now are his gall bladder, cardiac issues, and pulmonary issues.  Appreciate help of Gen Surgery, Cardiology, and Pulmonary.   Waverly Ferrari, MD, FACS Beeper [phone] Office: [phone]

## 2014-07-18 NOTE — Progress Notes (Signed)
Name: Christian Dominguez MRN: [id number] DOB: 11/29/1921    ADMISSION DATE:  07/15/2014 CONSULTATION DATE:  07/17/2014  REFERRING MD :  Dr. Darrick Penna  CHIEF COMPLAINT:  Hypoxia  BRIEF PATIENT DESCRIPTION: 81yo M admitted with AAA repair and was found have gal stone pancreatitis, while in the hospital was noted to be desaturating and PCCM was consulted.  Patient has complained of SOB for years but has sought no medical attention and has not had his O2 sat checked, does not see doctors.  CT of the abdomen done in the hospital, pulmonary windows showed severe and chronic fibrosis.  SIGNIFICANT EVENTS  7/12: Went for CTA abd/pelv to assess AAA and found to have Cr of 2.25, BUN 41, recommend admit to hospital 7/13: ED for abdominal pain, N, V - CTA done. Surgery - EVAR for symptomatic AAA.  7/14: 1L of fluid overnight for low BP, awoke with increased hypoxia 7/15: PCCM  STUDIES:  CT abd: lower lobe cystic changes with some intralobular septal thickening and groundglass. CXR: evidence of some minor pulmonary edema but fibrosis present. TTE: Preserved systolic function with indeterminate LV filling pressure. Flattening of the LV septum with moderately dilated RV and peak pulmonary artery pressure 65 mmHg plus right atrial pressure. Trivial aortic regurgitation and mild mitral regurgitation were noted.  SUBJECTIVE: The patient continues to have dyspnea at rest but reports this is not significantly changed from yesterday. Denies any significant cough. Denies any chest pain or pressure. Denies any subjective fever, chills, or sweats.  ROS: No nausea, vomiting, or diarrhea. No headache or vision changes.  VITAL SIGNS: Temp:  [98 F (36.7 C)-99 F (37.2 C)] 98.4 F (36.9 C) (07/16 1200) Pulse Rate:  [73-129] 75 (07/16 1200) Resp:  [19-36] 29 (07/16 1200) BP: (94-114)/(38-72) 105/61 mmHg (07/16 1200) SpO2:  [88 %-97 %] 95 % (07/16 1200) Arterial Line BP: (108-127)/(49-64) 127/57 mmHg (07/16 1200) FiO2  (%):  [40 %] 40 % (07/16 0406)  PHYSICAL EXAMINATION: General:  Awake. Alert. No acute distress. Lying in bedside chair with family at bedside.  Integument:  Warm & dry. No rash on exposed skin. No bruising. Right wrist in immobilizer. HEENT: Tacky mucus membranes. No scleral injection or icterus.  Cardiovascular:  Regular rate. No edema. No appreciable JVD.  Pulmonary:  Good aeration bilaterally but does have crackles on auscultation. Symmetric chest wall expansion. No accessory muscle use on Ventimask. Abdomen: Soft. Normal bowel sounds. Nondistended. Grossly nontender. Neurological:  CN 2-12 grossly in tact. No meningismus. Moving all 4 extremities equally.    Recent Labs Lab 07/17/14 0424 07/17/14 1605 07/18/14 0538  NA 135 132* 133*  K 4.3 4.3 3.7  CL 106 97* 98*  CO2 22 24 27   BUN 26* 28* 30*  CREATININE 2.09* 2.30* 2.10*  GLUCOSE 94 172* 120*    Recent Labs Lab 07/16/14 0510 07/17/14 0424 07/18/14 0538  HGB 8.8* 8.4* 10.3*  HCT 26.3* 24.8* 30.2*  WBC 24.9* 19.0* 19.5*  PLT 229 205 223   Dg Chest Port 1 View  07/17/2014   CLINICAL DATA:  Shortness of Breath  EXAM: PORTABLE CHEST - 1 VIEW  COMPARISON:  July 16, 2014  FINDINGS: Central catheter tip is in the superior vena cava. No pneumothorax. There is widespread fibrotic type change in the lungs. There may well be superimposed interstitial edema. There is no airspace consolidation. Heart is upper normal in size with evidence of a degree of pulmonary venous hypertension. No adenopathy appreciable.  IMPRESSION: The appearance is indicative  of a degree of congestive heart failure superimposed on chronic interstitial fibrosis. No airspace consolidation. No pneumothorax.   Electronically Signed   By: William  Woodruff III M.D.   On: 07/17/2014 07:40  Bretta Bang PLAN: 1. ILD: Question was secondary to IPF versus occupational exposure. Needs high-resolution CT scan to determine exact pattern and extensive fibrotic  changes.  2. Acute hypoxic respiratory failure: Recommend continuing to wean FiO2 for saturations greater than 92%. Agree with continuing diuresis with Lasix as renal function and hemodynamics allow.  3. Pulmonary hypertension: Suggested on transthoracic echocardiogram. Patient would not likely benefit from any pulmonary arterial vasodilator therapy as this is likely secondary to his underlying ILD.  4. Follow-up: Patient should have close follow-up as an outpatient with pulmonary upon discharge.  Donna Christen Jamison Neighbor, M.D. [facility] Pager:  [phone] After 3pm or if no response, call [phone]   07/18/2014, 2:39 PM

## 2014-07-18 NOTE — Progress Notes (Addendum)
SUBJECTIVE:  No CP or palpitations.  OBJECTIVE:   Vitals:   Filed Vitals:   07/18/14 0537 07/18/14 0727 07/18/14 0730 07/18/14 0751  BP: 112/64 114/72    Pulse:  126 129 78  Temp:  98.1 F (36.7 C)    TempSrc:  Oral    Resp:  36 27   Height:      Weight:      SpO2:  89% 89% 88%   I&O's:   Intake/Output Summary (Last 24 hours) at 07/18/14 1207 Last data filed at 07/18/14 1045  Gross per 24 hour  Intake    360 ml  Output   3525 ml  Net  -3165 ml   TELEMETRY: Reviewed telemetry pt in intermittent atrial tach:     PHYSICAL EXAM General: Well developed, well nourished, in no acute distress Head:   Normal cephalic and atramatic  Lungs:  Coarse Breath sounds bilaterally to auscultation. Heart:  irregular S1 S2  No JVD.   Abdomen: abdomen soft and non-tender Msk:  Back normal,  Normal strength and tone for age. Extremities:   No edema.   Neuro: Alert and oriented. Psych:  Normal affect, responds appropriately Skin: No rash   LABS: Basic Metabolic Panel:  Recent Labs  16/10/96 0048  07/17/14 1605 07/18/14 0538  NA 130*  < > 132* 133*  K 4.1  < > 4.3 3.7  CL 99*  < > 97* 98*  CO2 23  < > 24 27  GLUCOSE 259*  < > 172* 120*  BUN 33*  < > 28* 30*  CREATININE 2.68*  < > 2.30* 2.10*  CALCIUM 7.5*  < > 7.7* 7.9*  MG 1.2*  --   --   --   < > = values in this interval not displayed. Liver Function Tests:  Recent Labs  07/16/14 1036 07/17/14 0424  AST 326* 136*  ALT 241* 119*  ALKPHOS 88 90  BILITOT 0.8 0.5  PROT 4.7* 4.9*  ALBUMIN 2.5* 2.3*    Recent Labs  07/17/14 0424 07/18/14 0538  LIPASE 703* 60*   CBC:  Recent Labs  07/17/14 0424 07/18/14 0538  WBC 19.0* 19.5*  HGB 8.4* 10.3*  HCT 24.8* 30.2*  MCV 88.3 86.8  PLT 205 223   Cardiac Enzymes: No results for input(s): CKTOTAL, CKMB, CKMBINDEX, TROPONINI in the last 72 hours. BNP: Invalid input(s): POCBNP D-Dimer: No results for input(s): DDIMER in the last 72 hours. Hemoglobin  A1C: No results for input(s): HGBA1C in the last 72 hours. Fasting Lipid Panel: No results for input(s): CHOL, HDL, LDLCALC, TRIG, CHOLHDL, LDLDIRECT in the last 72 hours. Thyroid Function Tests:  Recent Labs  07/16/14 1130  TSH 6.468*   Anemia Panel: No results for input(s): VITAMINB12, FOLATE, FERRITIN, TIBC, IRON, RETICCTPCT in the last 72 hours. Coag Panel:   Lab Results  Component Value Date   INR 1.21 07/16/2014    RADIOLOGY: Dg Chest Port 1 View  07/17/2014   CLINICAL DATA:  Shortness of Breath  EXAM: PORTABLE CHEST - 1 VIEW  COMPARISON:  July 16, 2014  FINDINGS: Central catheter tip is in the superior vena cava. No pneumothorax. There is widespread fibrotic type change in the lungs. There may well be superimposed interstitial edema. There is no airspace consolidation. Heart is upper normal in size with evidence of a degree of pulmonary venous hypertension. No adenopathy appreciable.  IMPRESSION: The appearance is indicative of a degree of congestive heart failure superimposed on chronic interstitial fibrosis.  No airspace consolidation. No pneumothorax.   Electronically Signed   By: Bretta Bang III M.D.   On: 07/17/2014 07:40   Dg Chest Port 1 View  07/16/2014   CLINICAL DATA:  Status post abdominal aortic aneurysm is arm repair ; history of diabetes.  EXAM: PORTABLE CHEST - 1 VIEW  COMPARISON:  Portable chest x-ray of July 16, 2014  FINDINGS: There remains elevation of the right hemidiaphragm. The interstitial markings of both lungs remain diffusely increased but may have slightly improved since yesterday's study. The cardiac silhouette remains enlarged. The pulmonary vascularity is less engorged. The right internal jugular Cordis sheath tip projects over the proximal portion of the SVC. There is no pleural effusion or pneumothorax. The observed bony thorax is unremarkable.  IMPRESSION: Slight interval improvement in CHF and bilateral pulmonary interstitial edema. Underlying  pulmonary fibrotic changes.   Electronically Signed   By: David  [place] M.D.   On: 07/16/2014 07:58   Dg Chest Port 1 View  07/16/2014   CLINICAL DATA:  Central line placement  EXAM: PORTABLE CHEST - 1 VIEW  COMPARISON:  None.  FINDINGS: Right central venous catheter with tip projecting over the low SVC. No pneumothorax. Normal heart size. Low lung volumes. Diffuse interstitial pattern to both lungs. Correlation with prior CT abdomen and pelvis demonstrates this to represent diffuse parenchymal fibrosis with cystic honeycomb changes. No blunting of costophrenic angles. No pneumothorax.  IMPRESSION: Right central venous catheter appears to be in satisfactory position. Diffuse coarse interstitial infiltration throughout both lungs with low lung volumes consistent with chronic fibrosis.   Electronically Signed   By: Burman Nieves M.D.   On: 07/16/2014 01:28   Dg Abd Portable 1v  07/16/2014   CLINICAL DATA:  Post abdominal aortic aneurysm surgery.  EXAM: PORTABLE ABDOMEN - 1 VIEW  COMPARISON:  CT abdomen and pelvis 07/15/2014  FINDINGS: Aorto bi-iliac stent graft is present. Bowel gas pattern is normal. Coarse infiltrates in the lung bases suggesting fibrosis.  IMPRESSION: Aorto bi-iliac stent graft. Normal bowel gas pattern. Fibrosis in the lung bases.   Electronically Signed   By: Burman Nieves M.D.   On: 07/16/2014 04:07   Ct Angio Abd/pel W/ And/or W/o  07/15/2014   CLINICAL DATA:  81 year old male with generalized abdominal pain, nausea and vomiting. History of gallstone and abdominal aortic aneurysm.  EXAM: CTA ABDOMEN AND PELVIS wITHOUT AND WITH CONTRAST  TECHNIQUE: Multidetector CT imaging of the abdomen and pelvis was performed using the standard protocol during bolus administration of intravenous contrast. Multiplanar reconstructed images and MIPs were obtained and reviewed to evaluate the vascular anatomy.  CONTRAST:  OMNIPAQUE IOHEXOL 350 MG/ML SOLN  COMPARISON:  None.  FINDINGS: There  are emphysematous changes with diffuse ground-glass appearance of the visualized lung bases. There is coronary vascular calcification. Top-normal cardiac size.  No intra-abdominal free air no free fluid.  There is focal heterogeneous uterine enhancement in the right lobe of the liver adjacent to the gallbladder. No definite lesion of identified. Subcentimeter scattered hypodense lesions are too small to characterize. There are multiple stones within the gallbladder. Trace pericholecystic fluid may be present. The common bile duct is normal in caliber. There is diffuse inflammatory changes and edema of the pancreas compatible with pancreatitis. No drainable fluid collection/abscess or pseudocyst identified. The spleen demonstrates a lobulated contour otherwise unremarkable. The adrenal glands appear unremarkable. The right kidney appears unremarkable. There is a focal area of hypoenhancement involving the inferior pole of the left kidney concerning  for a focal hypo profusion/infarct. A small accessory renal artery is noted extending from the distal aorta inferior to the aneurysm into the inferior pole of the left kidney. There is apparent hypo enhancement of the origin of this accessory artery (series 401, image 139 and coronal series 46, image 47) which may account for hypoperfusion of this portion of the left kidney. There is no hydronephrosis on either side. There is apparent diffuse thickening of the bladder wall which may be partly related to underdistention. Cystitis is not excluded. Correlation with urinalysis recommended. The prostate gland and seminal vesicles are grossly unremarkable.  Small hiatal hernia. There is apparent thickening of the stomach and duodenum, likely related to inflammatory changes of the pancreas. There is a 4.4 x 2.7 cm duodenal diverticulum. Inflammatory changes surrounding the diverticulum are likely related to pancreatitis and less likely represent diverticulitis of the duodenum.  There is extensive sigmoid and colonic diverticulosis without active inflammatory changes. No evidence of bowel obstruction. Normal appendix.  There is aortoiliac atherosclerotic disease. The aorta is tortuous. There is an infrarenal abdominal aortic aneurysm measuring up to 6.9 cm in greatest axial dimension. This aneurysm starts approximately 4.2 cm inferior to the origins of the left renal artery and extends to the level of the bifurcation of the aorta. No definite extravasation of the contrast identified outside of the confines of the lumen of the aneurysm. Mild haziness of the periaortic fat is likely related to inflammatory changes extending from the pancreas. There is focal narrowing of the origin of the SMA with approximately 40- 50% luminal narrowing. There is atherosclerotic calcification of the ostia of the renal arteries. The origins of the celiac axis, IMA as well as the origins of the renal arteries remain patent. There is no lymphadenopathy. The visualized IVC appears unremarkable. No portal venous gas identified. The splenic vein is patent.  Fat containing left inguinal hernia without evidence of inflammation or incarceration. Degenerative changes of the spine. No acute fracture.  Review of the MIP images confirms the above findings.  IMPRESSION: Pancreatitis possibly related to gallstone. No definite stone identified in the central CBD.  Infrarenal abdominal aortic aneurysm. No evidence of extravasation of contrast identified.  Cholelithiasis.  Focal hypoenhancement of the inferior aspect of the left kidney concerning for infarct. A small accessory artery appears to extend from the inferior aorta to the lower pole of the left kidney.  The duodenal diverticulum.  Extensive colonic diverticulosis.  Critical Value/emergent results were called by telephone at the time of interpretation on 07/15/2014 at 8:59 pm to Dr. Derinda Sis , who verbally acknowledged these results.   Electronically Signed   By:  Elgie Collard M.D.   On: 07/15/2014 21:50      ASSESSMENT: Christian Dominguez:    Atrial tach: Still with rates > 100.  Add metoprolol.  BP may not tolerate more diltiazem.    Diuresed well yesterday.  Transfused.  Does not appear volume overloaded at this time.  Renal function stable.  Will start Lasix 40 mg daily.   Pulm edema by prior chest xray.  Chronic diastolic heart failure. Normal LVEF by echo.  CRI: improving.    Corky Crafts, MD  07/18/2014  12:07 PM

## 2014-07-18 NOTE — Progress Notes (Signed)
Patient ID: Christian Dominguez, male   DOB: [birth year], 81 y.o.   MRN: [id number]     [address] [place], [place] [address]    Phone: [phone] FAX: [phone]     Subjective: Pt up in chair.  RR up, HR up.  venti mask on.  No abdominal pain, n/v, but feels "sore all over."  Family at bedside.  Diuresed 3.5L with lasix.   Objective:  Vital signs:  Filed Vitals:   07/18/14 0000 07/18/14 0014 07/18/14 0406 07/18/14 0537  BP: 112/61 112/61 98/47 112/64  Pulse: 73 73 95   Temp:  98.9 F (37.2 C) 99 F (37.2 C)   TempSrc:  Oral Oral   Resp: 26 24 26    Height:      Weight:      SpO2: 97% 97% 95%        Intake/Output   Yesterday:  07/15 0701 - 07/16 0700 In: 600 [P.O.:600] Out: 3525 [Urine:3525] This shift:    I/O last 3 completed shifts: In: 600 [P.O.:600] Out: 4375 [Urine:4375]    Physical Exam: General: Pt awake/alert/oriented x4 in no acute distress  Abdomen: +bs, abdomen is soft and non tender.   Problem List:   Active Problems:   Abdominal aortic aneurysm without rupture   Atrial tachycardia, paroxysmal    Results:   Labs: Results for orders placed or performed during the hospital encounter of 07/15/14 (from the past 48 hour(s))  Glucose, capillary     Status: Abnormal   Collection Time: 07/16/14  8:17 AM  Result Value Ref Range   Glucose-Capillary 107 (H) 65 - 99 mg/dL   Comment 1 Notify RN    Comment 2 Document in Chart   Urinalysis, Routine w reflex microscopic (not at [facility])     Status: Abnormal   Collection Time: 07/16/14  9:28 AM  Result Value Ref Range   Color, Urine YELLOW YELLOW   APPearance CLOUDY (A) CLEAR   Specific Gravity, Urine 1.031 (H) 1.005 - 1.030   pH 5.0 5.0 - 8.0   Glucose, UA NEGATIVE NEGATIVE mg/dL   Hgb urine dipstick LARGE (A) NEGATIVE   Bilirubin Urine NEGATIVE NEGATIVE   Ketones, ur NEGATIVE NEGATIVE mg/dL   Protein, ur NEGATIVE NEGATIVE mg/dL   Urobilinogen, UA 1.0 0.0 - 1.0 mg/dL   Nitrite NEGATIVE NEGATIVE   Leukocytes, UA SMALL (A) NEGATIVE  Urine microscopic-add on     Status: Abnormal   Collection Time: 07/16/14  9:28 AM  Result Value Ref Range   Squamous Epithelial / LPF RARE RARE   WBC, UA 0-2 <3 WBC/hpf   RBC / HPF TOO NUMEROUS TO COUNT <3 RBC/hpf   Bacteria, UA FEW (A) RARE   Urine-Other AMORPHOUS URATES/PHOSPHATES   Lipase, blood     Status: Abnormal   Collection Time: 07/16/14 10:36 AM  Result Value Ref Range   Lipase 1274 (H) 22 - 51 U/L    Comment: RESULTS CONFIRMED BY MANUAL DILUTION  Hepatic function panel     Status: Abnormal   Collection Time: 07/16/14 10:36 AM  Result Value Ref Range   Total Protein 4.7 (L) 6.5 - 8.1 g/dL   Albumin 2.5 (L) 3.5 - 5.0 g/dL   AST 326 (H) 15 - 41 U/L   ALT 241 (H) 17 - 63 U/L   Alkaline Phosphatase 88 38 - 126 U/L   Total Bilirubin 0.8 0.3 - 1.2 mg/dL   Bilirubin,  Direct 0.4 0.1 - 0.5 mg/dL   Indirect Bilirubin 0.4 0.3 - 0.9 mg/dL  TSH     Status: Abnormal   Collection Time: 07/16/14 11:30 AM  Result Value Ref Range   TSH 6.468 (H) 0.350 - 4.500 uIU/mL  Brain natriuretic peptide     Status: Abnormal   Collection Time: 07/16/14 11:30 AM  Result Value Ref Range   B Natriuretic Peptide 383.9 (H) 0.0 - 100.0 pg/mL  Glucose, capillary     Status: None   Collection Time: 07/16/14 12:21 PM  Result Value Ref Range   Glucose-Capillary 94 65 - 99 mg/dL   Comment 1 Notify RN    Comment 2 Document in Chart   Glucose, capillary     Status: None   Collection Time: 07/16/14  4:43 PM  Result Value Ref Range   Glucose-Capillary 75 65 - 99 mg/dL  Glucose, capillary     Status: None   Collection Time: 07/16/14  9:29 PM  Result Value Ref Range   Glucose-Capillary 95 65 - 99 mg/dL  Lipase, blood     Status: Abnormal   Collection Time: 07/17/14  4:24 AM  Result Value Ref Range   Lipase 703 (H) 22 - 51 U/L    Comment: RESULTS CONFIRMED BY MANUAL DILUTION  Comprehensive metabolic  panel     Status: Abnormal   Collection Time: 07/17/14  4:24 AM  Result Value Ref Range   Sodium 135 135 - 145 mmol/L   Potassium 4.3 3.5 - 5.1 mmol/L   Chloride 106 101 - 111 mmol/L   CO2 22 22 - 32 mmol/L   Glucose, Bld 94 65 - 99 mg/dL   BUN 26 (H) 6 - 20 mg/dL   Creatinine, Ser 2.09 (H) 0.61 - 1.24 mg/dL   Calcium 7.3 (L) 8.9 - 10.3 mg/dL   Total Protein 4.9 (L) 6.5 - 8.1 g/dL   Albumin 2.3 (L) 3.5 - 5.0 g/dL   AST 136 (H) 15 - 41 U/L   ALT 119 (H) 17 - 63 U/L   Alkaline Phosphatase 90 38 - 126 U/L   Total Bilirubin 0.5 0.3 - 1.2 mg/dL   GFR calc non Af Amer 26 (L) >60 mL/min   GFR calc Af Amer 30 (L) >60 mL/min    Comment: (NOTE) The eGFR has been calculated using the CKD EPI equation. This calculation has not been validated in all clinical situations. eGFR's persistently <60 mL/min signify possible Chronic Kidney Disease.    Anion gap 7 5 - 15  CBC     Status: Abnormal   Collection Time: 07/17/14  4:24 AM  Result Value Ref Range   WBC 19.0 (H) 4.0 - 10.5 K/uL   RBC 2.81 (L) 4.22 - 5.81 MIL/uL   Hemoglobin 8.4 (L) 13.0 - 17.0 g/dL   HCT 24.8 (L) 39.0 - 52.0 %   MCV 88.3 78.0 - 100.0 fL   MCH 29.9 26.0 - 34.0 pg   MCHC 33.9 30.0 - 36.0 g/dL   RDW 14.6 11.5 - 15.5 %   Platelets 205 150 - 400 K/uL  Glucose, capillary     Status: Abnormal   Collection Time: 07/17/14  7:58 AM  Result Value Ref Range   Glucose-Capillary 100 (H) 65 - 99 mg/dL   Comment 1 Notify RN    Comment 2 Document in Chart   Prepare RBC     Status: None   Collection Time: 07/17/14 10:12 AM  Result Value Ref Range   Order  Confirmation      ORDER PROCESSED BY BLOOD BANK BB SAMPLE OR UNITS ALREADY AVAILABLE  Glucose, capillary     Status: Abnormal   Collection Time: 07/17/14 11:38 AM  Result Value Ref Range   Glucose-Capillary 101 (H) 65 - 99 mg/dL  Brain natriuretic peptide     Status: Abnormal   Collection Time: 07/17/14  3:45 PM  Result Value Ref Range   B Natriuretic Peptide 307.7 (H) 0.0 -  100.0 pg/mL  Glucose, capillary     Status: Abnormal   Collection Time: 07/17/14  3:59 PM  Result Value Ref Range   Glucose-Capillary 145 (H) 65 - 99 mg/dL   Comment 1 Notify RN    Comment 2 Document in Chart   Basic metabolic panel     Status: Abnormal   Collection Time: 07/17/14  4:05 PM  Result Value Ref Range   Sodium 132 (L) 135 - 145 mmol/L   Potassium 4.3 3.5 - 5.1 mmol/L   Chloride 97 (L) 101 - 111 mmol/L   CO2 24 22 - 32 mmol/L   Glucose, Bld 172 (H) 65 - 99 mg/dL   BUN 28 (H) 6 - 20 mg/dL   Creatinine, Ser 2.30 (H) 0.61 - 1.24 mg/dL   Calcium 7.7 (L) 8.9 - 10.3 mg/dL   GFR calc non Af Amer 23 (L) >60 mL/min   GFR calc Af Amer 27 (L) >60 mL/min    Comment: (NOTE) The eGFR has been calculated using the CKD EPI equation. This calculation has not been validated in all clinical situations. eGFR's persistently <60 mL/min signify possible Chronic Kidney Disease.    Anion gap 11 5 - 15  Glucose, capillary     Status: Abnormal   Collection Time: 07/17/14  9:48 PM  Result Value Ref Range   Glucose-Capillary 166 (H) 65 - 99 mg/dL   Comment 1 Notify RN   Lipase, blood     Status: Abnormal   Collection Time: 07/18/14  5:38 AM  Result Value Ref Range   Lipase 60 (H) 22 - 51 U/L  CBC     Status: Abnormal   Collection Time: 07/18/14  5:38 AM  Result Value Ref Range   WBC 19.5 (H) 4.0 - 10.5 K/uL   RBC 3.48 (L) 4.22 - 5.81 MIL/uL   Hemoglobin 10.3 (L) 13.0 - 17.0 g/dL   HCT 30.2 (L) 39.0 - 52.0 %   MCV 86.8 78.0 - 100.0 fL   MCH 29.6 26.0 - 34.0 pg   MCHC 34.1 30.0 - 36.0 g/dL   RDW 14.5 11.5 - 15.5 %   Platelets 223 150 - 400 K/uL  Basic metabolic panel     Status: Abnormal   Collection Time: 07/18/14  5:38 AM  Result Value Ref Range   Sodium 133 (L) 135 - 145 mmol/L   Potassium 3.7 3.5 - 5.1 mmol/L   Chloride 98 (L) 101 - 111 mmol/L   CO2 27 22 - 32 mmol/L   Glucose, Bld 120 (H) 65 - 99 mg/dL   BUN 30 (H) 6 - 20 mg/dL   Creatinine, Ser 2.10 (H) 0.61 - 1.24 mg/dL    Calcium 7.9 (L) 8.9 - 10.3 mg/dL   GFR calc non Af Amer 26 (L) >60 mL/min   GFR calc Af Amer 30 (L) >60 mL/min    Comment: (NOTE) The eGFR has been calculated using the CKD EPI equation. This calculation has not been validated in all clinical situations. eGFR's persistently <60 mL/min signify  possible Chronic Kidney Disease.    Anion gap 8 5 - 15    Imaging / Studies: Dg Chest Port 1 View  07/17/2014   CLINICAL DATA:  Shortness of Breath  EXAM: PORTABLE CHEST - 1 VIEW  COMPARISON:  July 16, 2014  FINDINGS: Central catheter tip is in the superior vena cava. No pneumothorax. There is widespread fibrotic type change in the lungs. There may well be superimposed interstitial edema. There is no airspace consolidation. Heart is upper normal in size with evidence of a degree of pulmonary venous hypertension. No adenopathy appreciable.  IMPRESSION: The appearance is indicative of a degree of congestive heart failure superimposed on chronic interstitial fibrosis. No airspace consolidation. No pneumothorax.   Electronically Signed   By: Lowella Grip III M.D.   On: 07/17/2014 07:40    Medications / Allergies:  Scheduled Meds: . sodium chloride   Intravenous Once  . diltiazem  30 mg Oral 4 times per day  . docusate sodium  100 mg Oral Daily  . enoxaparin (LOVENOX) injection  30 mg Subcutaneous Q24H  . insulin aspart  0-9 Units Subcutaneous TID WC  . levothyroxine  50 mcg Oral QAC breakfast  . pantoprazole  40 mg Oral Daily  . pravastatin  40 mg Oral QHS   Continuous Infusions:  PRN Meds:.acetaminophen **OR** acetaminophen, albuterol, alum & mag hydroxide-simeth, bisacodyl, guaiFENesin-dextromethorphan, hydrALAZINE, labetalol, magnesium sulfate 1 - 4 g bolus IVPB, metoprolol, morphine injection, ondansetron, ondansetron, oxyCODONE, phenol, potassium chloride  Antibiotics: Anti-infectives    Start     Dose/Rate Route Frequency Ordered Stop   07/16/14 0800  cefUROXime (ZINACEF) 1.5 g in  dextrose 5 % 50 mL IVPB     1.5 g 100 mL/hr over 30 Minutes Intravenous Every 12 hours 07/16/14 0041 07/16/14 2041        Assessment/Plan Gallstone pancreatitis-lipase normal, LFTs are stable and abdomen is soft and non tender.  The patient is still requiring venti mask, diuresing for CHF and SOB. I do not think he's close to being ready for surgery.  Will allow for clears and advance as tolerated.  Will continue to re-evaluate daily and follow cards/pulm recs for proceeding with surgery.   POD#3 AAA EVAR Respiratory failure/pulmonary fibrosis-pulm on board PAF/CHF-cards following  ARF-improving VTE prophylaxis-SCD/lovenox ID-leukocytosis, but not fevers Dispo-not ready for surgery today  Erby Pian, ANP-BC [facility] Pager [phone])   07/18/2014  7:58 AM

## 2014-07-19 ENCOUNTER — Inpatient Hospital Stay (HOSPITAL_COMMUNITY): Payer: Medicare Other

## 2014-07-19 LAB — TYPE AND SCREEN
ABO/RH(D): A POS
Antibody Screen: NEGATIVE
UNIT DIVISION: 0
Unit division: 0
Unit division: 0
Unit division: 0

## 2014-07-19 LAB — CBC
HCT: 27.7 % — ABNORMAL LOW (ref 39.0–52.0)
Hemoglobin: 9.5 g/dL — ABNORMAL LOW (ref 13.0–17.0)
MCH: 29.4 pg (ref 26.0–34.0)
MCHC: 34.3 g/dL (ref 30.0–36.0)
MCV: 85.8 fL (ref 78.0–100.0)
Platelets: 211 10*3/uL (ref 150–400)
RBC: 3.23 MIL/uL — ABNORMAL LOW (ref 4.22–5.81)
RDW: 14 % (ref 11.5–15.5)
WBC: 15.1 10*3/uL — ABNORMAL HIGH (ref 4.0–10.5)

## 2014-07-19 LAB — GLUCOSE, CAPILLARY
Glucose-Capillary: 124 mg/dL — ABNORMAL HIGH (ref 65–99)
Glucose-Capillary: 161 mg/dL — ABNORMAL HIGH (ref 65–99)
Glucose-Capillary: 188 mg/dL — ABNORMAL HIGH (ref 65–99)
Glucose-Capillary: 132 mg/dL — ABNORMAL HIGH (ref 65–99)

## 2014-07-19 LAB — BASIC METABOLIC PANEL
Anion gap: 12 (ref 5–15)
BUN: 30 mg/dL — ABNORMAL HIGH (ref 6–20)
CALCIUM: 7.9 mg/dL — AB (ref 8.9–10.3)
CO2: 25 mmol/L (ref 22–32)
Chloride: 94 mmol/L — ABNORMAL LOW (ref 101–111)
Creatinine, Ser: 2.2 mg/dL — ABNORMAL HIGH (ref 0.61–1.24)
GFR calc Af Amer: 29 mL/min — ABNORMAL LOW (ref >60)
GFR, EST NON AFRICAN AMERICAN: 25 mL/min — AB (ref >60)
Glucose, Bld: 120 mg/dL — ABNORMAL HIGH (ref 65–99)
Potassium: 3.4 mmol/L — ABNORMAL LOW (ref 3.5–5.1)
SODIUM: 131 mmol/L — AB (ref 135–145)

## 2014-07-19 LAB — LIPASE, BLOOD: Lipase: 28 U/L (ref 22–51)

## 2014-07-19 MED ORDER — CETYLPYRIDINIUM CHLORIDE 0.05 % MT LIQD
7.0000 mL | Freq: Two times a day (BID) | OROMUCOSAL | Status: DC
  Administered 2014-07-19 – 2014-07-23 (×7): 7 mL via OROMUCOSAL

## 2014-07-19 MED ORDER — METOPROLOL TARTRATE 12.5 MG HALF TABLET
12.5000 mg | ORAL_TABLET | Freq: Once | ORAL | Status: AC
  Administered 2014-07-19: 12.5 mg via ORAL
  Filled 2014-07-19: qty 1

## 2014-07-19 MED ORDER — METOPROLOL TARTRATE 25 MG PO TABS
25.0000 mg | ORAL_TABLET | Freq: Two times a day (BID) | ORAL | Status: DC
  Administered 2014-07-19 – 2014-07-23 (×8): 25 mg via ORAL
  Filled 2014-07-19 (×10): qty 1

## 2014-07-19 NOTE — Progress Notes (Signed)
Patient ID: Christian Dominguez, male   DOB: 11/07/1921, 81 y.o.   MRN: [id number]     [address] [place], [place] [address]    Phone: [phone] FAX: [phone]     Subjective: No abdominal pain, nausea or vomiting.  Tolerated clears.WBC down.  Afebrile.   Objective:  Vital signs:  Filed Vitals:   07/18/14 2347 07/19/14 0200 07/19/14 0425 07/19/14 0559  BP: 105/63 118/44 107/41 128/58  Pulse: 68 75 76   Temp:   98 F (36.7 C)   TempSrc:   Oral   Resp: 19 26 25    Height:      Weight:      SpO2: 95% 94% 91%        Intake/Output   Yesterday:  07/16 0701 - 07/17 0700 In: -  Out: 7169 [Urine:1075] This shift:    I/O last 3 completed shifts: In: -  Out: 2875 [Urine:2875]    Physical Exam: General: Pt awake/alert/oriented x4 in no acute distress Abdomen: Soft.  Nondistended.  nontender.  No evidence of peritonitis.  No incarcerated hernias.   Problem List:   Active Problems:   Abdominal aortic aneurysm without rupture   Atrial tachycardia, paroxysmal    Results:   Labs: Results for orders placed or performed during the hospital encounter of 07/15/14 (from the past 48 hour(s))  Prepare RBC     Status: None   Collection Time: 07/17/14 10:12 AM  Result Value Ref Range   Order Confirmation      ORDER PROCESSED BY BLOOD BANK BB SAMPLE OR UNITS ALREADY AVAILABLE  Glucose, capillary     Status: Abnormal   Collection Time: 07/17/14 11:38 AM  Result Value Ref Range   Glucose-Capillary 101 (H) 65 - 99 mg/dL  Brain natriuretic peptide     Status: Abnormal   Collection Time: 07/17/14  3:45 PM  Result Value Ref Range   B Natriuretic Peptide 307.7 (H) 0.0 - 100.0 pg/mL  Glucose, capillary     Status: Abnormal   Collection Time: 07/17/14  3:59 PM  Result Value Ref Range   Glucose-Capillary 145 (H) 65 - 99 mg/dL   Comment 1 Notify RN    Comment 2 Document in Chart   Basic metabolic panel     Status:  Abnormal   Collection Time: 07/17/14  4:05 PM  Result Value Ref Range   Sodium 132 (L) 135 - 145 mmol/L   Potassium 4.3 3.5 - 5.1 mmol/L   Chloride 97 (L) 101 - 111 mmol/L   CO2 24 22 - 32 mmol/L   Glucose, Bld 172 (H) 65 - 99 mg/dL   BUN 28 (H) 6 - 20 mg/dL   Creatinine, Ser 2.30 (H) 0.61 - 1.24 mg/dL   Calcium 7.7 (L) 8.9 - 10.3 mg/dL   GFR calc non Af Amer 23 (L) >60 mL/min   GFR calc Af Amer 27 (L) >60 mL/min    Comment: (NOTE) The eGFR has been calculated using the CKD EPI equation. This calculation has not been validated in all clinical situations. eGFR's persistently <60 mL/min signify possible Chronic Kidney Disease.    Anion gap 11 5 - 15  Glucose, capillary     Status: Abnormal   Collection Time: 07/17/14  9:48 PM  Result Value Ref Range   Glucose-Capillary 166 (H) 65 - 99 mg/dL   Comment 1 Notify RN   Lipase, blood     Status:  Abnormal   Collection Time: 07/18/14  5:38 AM  Result Value Ref Range   Lipase 60 (H) 22 - 51 U/L  CBC     Status: Abnormal   Collection Time: 07/18/14  5:38 AM  Result Value Ref Range   WBC 19.5 (H) 4.0 - 10.5 K/uL   RBC 3.48 (L) 4.22 - 5.81 MIL/uL   Hemoglobin 10.3 (L) 13.0 - 17.0 g/dL   HCT 30.2 (L) 39.0 - 52.0 %   MCV 86.8 78.0 - 100.0 fL   MCH 29.6 26.0 - 34.0 pg   MCHC 34.1 30.0 - 36.0 g/dL   RDW 14.5 11.5 - 15.5 %   Platelets 223 150 - 400 K/uL  Basic metabolic panel     Status: Abnormal   Collection Time: 07/18/14  5:38 AM  Result Value Ref Range   Sodium 133 (L) 135 - 145 mmol/L   Potassium 3.7 3.5 - 5.1 mmol/L   Chloride 98 (L) 101 - 111 mmol/L   CO2 27 22 - 32 mmol/L   Glucose, Bld 120 (H) 65 - 99 mg/dL   BUN 30 (H) 6 - 20 mg/dL   Creatinine, Ser 2.10 (H) 0.61 - 1.24 mg/dL   Calcium 7.9 (L) 8.9 - 10.3 mg/dL   GFR calc non Af Amer 26 (L) >60 mL/min   GFR calc Af Amer 30 (L) >60 mL/min    Comment: (NOTE) The eGFR has been calculated using the CKD EPI equation. This calculation has not been validated in all clinical  situations. eGFR's persistently <60 mL/min signify possible Chronic Kidney Disease.    Anion gap 8 5 - 15  Glucose, capillary     Status: Abnormal   Collection Time: 07/18/14  8:27 AM  Result Value Ref Range   Glucose-Capillary 131 (H) 65 - 99 mg/dL  Glucose, capillary     Status: Abnormal   Collection Time: 07/18/14 11:55 AM  Result Value Ref Range   Glucose-Capillary 118 (H) 65 - 99 mg/dL  Glucose, capillary     Status: Abnormal   Collection Time: 07/18/14  4:39 PM  Result Value Ref Range   Glucose-Capillary 163 (H) 65 - 99 mg/dL  Glucose, capillary     Status: None   Collection Time: 07/18/14  9:38 PM  Result Value Ref Range   Glucose-Capillary 89 65 - 99 mg/dL   Comment 1 Notify RN   Lipase, blood     Status: None   Collection Time: 07/19/14  5:54 AM  Result Value Ref Range   Lipase 28 22 - 51 U/L  CBC     Status: Abnormal   Collection Time: 07/19/14  5:54 AM  Result Value Ref Range   WBC 15.1 (H) 4.0 - 10.5 K/uL   RBC 3.23 (L) 4.22 - 5.81 MIL/uL   Hemoglobin 9.5 (L) 13.0 - 17.0 g/dL   HCT 27.7 (L) 39.0 - 52.0 %   MCV 85.8 78.0 - 100.0 fL   MCH 29.4 26.0 - 34.0 pg   MCHC 34.3 30.0 - 36.0 g/dL   RDW 14.0 11.5 - 15.5 %   Platelets 211 150 - 400 K/uL  Basic metabolic panel     Status: Abnormal   Collection Time: 07/19/14  5:54 AM  Result Value Ref Range   Sodium 131 (L) 135 - 145 mmol/L   Potassium 3.4 (L) 3.5 - 5.1 mmol/L   Chloride 94 (L) 101 - 111 mmol/L   CO2 25 22 - 32 mmol/L   Glucose, Bld 120 (H)  65 - 99 mg/dL   BUN 30 (H) 6 - 20 mg/dL   Creatinine, Ser 2.20 (H) 0.61 - 1.24 mg/dL   Calcium 7.9 (L) 8.9 - 10.3 mg/dL   GFR calc non Af Amer 25 (L) >60 mL/min   GFR calc Af Amer 29 (L) >60 mL/min    Comment: (NOTE) The eGFR has been calculated using the CKD EPI equation. This calculation has not been validated in all clinical situations. eGFR's persistently <60 mL/min signify possible Chronic Kidney Disease.    Anion gap 12 5 - 15    Imaging /  Studies: No results found.  Medications / Allergies:  Scheduled Meds: . sodium chloride   Intravenous Once  . diltiazem  30 mg Oral 4 times per day  . docusate sodium  100 mg Oral Daily  . enoxaparin (LOVENOX) injection  30 mg Subcutaneous Q24H  . insulin aspart  0-9 Units Subcutaneous TID WC  . levothyroxine  50 mcg Oral QAC breakfast  . metoprolol tartrate  12.5 mg Oral BID  . pantoprazole  40 mg Oral Daily  . pravastatin  40 mg Oral QHS   Continuous Infusions:  PRN Meds:.acetaminophen **OR** acetaminophen, albuterol, alum & mag hydroxide-simeth, bisacodyl, guaiFENesin-dextromethorphan, hydrALAZINE, labetalol, magnesium sulfate 1 - 4 g bolus IVPB, metoprolol, morphine injection, ondansetron, ondansetron, oxyCODONE, phenol, potassium chloride  Antibiotics: Anti-infectives    Start     Dose/Rate Route Frequency Ordered Stop   07/16/14 0800  cefUROXime (ZINACEF) 1.5 g in dextrose 5 % 50 mL IVPB     1.5 g 100 mL/hr over 30 Minutes Intravenous Every 12 hours 07/16/14 0041 07/16/14 2041       Assessment/Plan Gallstone pancreatitis-lipase normal, LFTs are stable and abdomen is soft and non tender. cardiac and respiratory status have improved.  D/w primary team regarding weaning venti mask and 02 parameters as requested by family. -advance diet, NPO after midnight.  Will re-evaluate in AM if ready for cholecystectomy -would appreciate cards and pulmonary input regarding proceeding with surgery POD#4 AAA EVAR Respiratory failure/pulmonary fibrosis-pulm on board PAF/CHF-cards following  ARF-improving VTE prophylaxis-SCD/lovenox ID-leukocytosis, but not fevers Dispo-not ready for surgery today  Erby Pian, [facility] Surgery Pager [phone])   07/19/2014 8:07 AM

## 2014-07-19 NOTE — Progress Notes (Addendum)
   VASCULAR SURGERY ASSESSMENT & PLAN:  * 4 Days Post-Op s/p: EVAR  *  Doing well from a vascular standpoint.  * Awaiting cholecystectomy once cardiopulmonary status is stable.  * Creatinine is stable at 2.2.  SUBJECTIVE: No specific complaints.  PHYSICAL EXAM: Filed Vitals:   07/18/14 2347 07/19/14 0200 07/19/14 0425 07/19/14 0559  BP: 105/63 118/44 107/41 128/58  Pulse: 68 75 76   Temp:   98 F (36.7 C)   TempSrc:   Oral   Resp: 19 26 25    Height:      Weight:      SpO2: 95% 94% 91%    Groins look fine without evidence of hematoma.  LABS: Lab Results  Component Value Date   WBC 15.1* 07/19/2014   HGB 9.5* 07/19/2014   HCT 27.7* 07/19/2014   MCV 85.8 07/19/2014   PLT 211 07/19/2014   Lab Results  Component Value Date   CREATININE 2.20* 07/19/2014   Lab Results  Component Value Date   INR 1.21 07/16/2014   CBG (last 3)   Recent Labs  07/18/14 1155 07/18/14 1639 07/18/14 2138  GLUCAP 118* 163* 89    Active Problems:   Abdominal aortic aneurysm without rupture   Atrial tachycardia, paroxysmal    Cari Caraway Beeper: [phone] 07/19/2014

## 2014-07-19 NOTE — Progress Notes (Signed)
SUBJECTIVE:  No CP or palpitations. Mainly sinus with periods of atrial tach that persists.  OBJECTIVE:   Vitals:   Filed Vitals:   07/19/14 0200 07/19/14 0425 07/19/14 0559 07/19/14 0816  BP: 118/44 107/41 128/58 114/55  Pulse: 75 76  71  Temp:  98 F (36.7 C)  97.9 F (36.6 C)  TempSrc:  Oral  Oral  Resp: 26 25  22   Height:      Weight:      SpO2: 94% 91%  92%   I&O's:    Intake/Output Summary (Last 24 hours) at 07/19/14 1049 Last data filed at 07/19/14 0426  Gross per 24 hour  Intake      0 ml  Output    875 ml  Net   -875 ml   TELEMETRY: Reviewed telemetry pt in intermittent atrial tach:  PHYSICAL EXAM General: Well developed, well nourished, in no acute distress Head:   Normal cephalic and atramatic  Lungs:  Coarse Breath sounds bilaterally to auscultation, dry crackles. Heart:  irregular S1 S2  No JVD.   Abdomen: abdomen soft and non-tender Msk:  Back normal,  Normal strength and tone for age. Extremities:   No edema.   Neuro: Alert and oriented. Psych:  Normal affect, responds appropriately Skin: No rash   LABS: Basic Metabolic Panel:  Recent Labs  47/42/59 0538 07/19/14 0554  NA 133* 131*  K 3.7 3.4*  CL 98* 94*  CO2 27 25  GLUCOSE 120* 120*  BUN 30* 30*  CREATININE 2.10* 2.20*  CALCIUM 7.9* 7.9*   Liver Function Tests:  Recent Labs  07/17/14 0424  AST 136*  ALT 119*  ALKPHOS 90  BILITOT 0.5  PROT 4.9*  ALBUMIN 2.3*    Recent Labs  07/18/14 0538 07/19/14 0554  LIPASE 60* 28   CBC:  Recent Labs  07/18/14 0538 07/19/14 0554  WBC 19.5* 15.1*  HGB 10.3* 9.5*  HCT 30.2* 27.7*  MCV 86.8 85.8  PLT 223 211   Cardiac Enzymes: No results for input(s): CKTOTAL, CKMB, CKMBINDEX, TROPONINI in the last 72 hours. BNP: Invalid input(s): POCBNP D-Dimer: No results for input(s): DDIMER in the last 72 hours. Hemoglobin A1C: No results for input(s): HGBA1C in the last 72 hours. Fasting Lipid Panel: No results for input(s):  CHOL, HDL, LDLCALC, TRIG, CHOLHDL, LDLDIRECT in the last 72 hours. Thyroid Function Tests:  Recent Labs  07/16/14 1130  TSH 6.468*   Anemia Panel: No results for input(s): VITAMINB12, FOLATE, FERRITIN, TIBC, IRON, RETICCTPCT in the last 72 hours. Coag Panel:   Lab Results  Component Value Date   INR 1.21 07/16/2014    RADIOLOGY: Dg Chest Port 1 View  07/17/2014   CLINICAL DATA:  Shortness of Breath  EXAM: PORTABLE CHEST - 1 VIEW  COMPARISON:  July 16, 2014  FINDINGS: Central catheter tip is in the superior vena cava. No pneumothorax. There is widespread fibrotic type change in the lungs. There may well be superimposed interstitial edema. There is no airspace consolidation. Heart is upper normal in size with evidence of a degree of pulmonary venous hypertension. No adenopathy appreciable.  IMPRESSION: The appearance is indicative of a degree of congestive heart failure superimposed on chronic interstitial fibrosis. No airspace consolidation. No pneumothorax.   Electronically Signed   By: Bretta Bang III M.D.   On: 07/17/2014 07:40   Dg Chest Port 1 View  07/16/2014   CLINICAL DATA:  Status post abdominal aortic aneurysm is arm repair ; history of  diabetes.  EXAM: PORTABLE CHEST - 1 VIEW  COMPARISON:  Portable chest x-ray of July 16, 2014  FINDINGS: There remains elevation of the right hemidiaphragm. The interstitial markings of both lungs remain diffusely increased but may have slightly improved since yesterday's study. The cardiac silhouette remains enlarged. The pulmonary vascularity is less engorged. The right internal jugular Cordis sheath tip projects over the proximal portion of the SVC. There is no pleural effusion or pneumothorax. The observed bony thorax is unremarkable.  IMPRESSION: Slight interval improvement in CHF and bilateral pulmonary interstitial edema. Underlying pulmonary fibrotic changes.   Electronically Signed   By: David  [place] M.D.   On: 07/16/2014 07:58   Dg Chest  Port 1 View  07/16/2014   CLINICAL DATA:  Central line placement  EXAM: PORTABLE CHEST - 1 VIEW  COMPARISON:  None.  FINDINGS: Right central venous catheter with tip projecting over the low SVC. No pneumothorax. Normal heart size. Low lung volumes. Diffuse interstitial pattern to both lungs. Correlation with prior CT abdomen and pelvis demonstrates this to represent diffuse parenchymal fibrosis with cystic honeycomb changes. No blunting of costophrenic angles. No pneumothorax.  IMPRESSION: Right central venous catheter appears to be in satisfactory position. Diffuse coarse interstitial infiltration throughout both lungs with low lung volumes consistent with chronic fibrosis.   Electronically Signed   By: Burman Nieves M.D.   On: 07/16/2014 01:28   Dg Abd Portable 1v  07/16/2014   CLINICAL DATA:  Post abdominal aortic aneurysm surgery.  EXAM: PORTABLE ABDOMEN - 1 VIEW  COMPARISON:  CT abdomen and pelvis 07/15/2014  FINDINGS: Aorto bi-iliac stent graft is present. Bowel gas pattern is normal. Coarse infiltrates in the lung bases suggesting fibrosis.  IMPRESSION: Aorto bi-iliac stent graft. Normal bowel gas pattern. Fibrosis in the lung bases.   Electronically Signed   By: Burman Nieves M.D.   On: 07/16/2014 04:07   Ct Angio Abd/pel W/ And/or W/o  07/15/2014   CLINICAL DATA:  81 year old male with generalized abdominal pain, nausea and vomiting. History of gallstone and abdominal aortic aneurysm.  EXAM: CTA ABDOMEN AND PELVIS wITHOUT AND WITH CONTRAST  TECHNIQUE: Multidetector CT imaging of the abdomen and pelvis was performed using the standard protocol during bolus administration of intravenous contrast. Multiplanar reconstructed images and MIPs were obtained and reviewed to evaluate the vascular anatomy.  CONTRAST:  OMNIPAQUE IOHEXOL 350 MG/ML SOLN  COMPARISON:  None.  FINDINGS: There are emphysematous changes with diffuse ground-glass appearance of the visualized lung bases. There is coronary  vascular calcification. Top-normal cardiac size.  No intra-abdominal free air no free fluid.  There is focal heterogeneous uterine enhancement in the right lobe of the liver adjacent to the gallbladder. No definite lesion of identified. Subcentimeter scattered hypodense lesions are too small to characterize. There are multiple stones within the gallbladder. Trace pericholecystic fluid may be present. The common bile duct is normal in caliber. There is diffuse inflammatory changes and edema of the pancreas compatible with pancreatitis. No drainable fluid collection/abscess or pseudocyst identified. The spleen demonstrates a lobulated contour otherwise unremarkable. The adrenal glands appear unremarkable. The right kidney appears unremarkable. There is a focal area of hypoenhancement involving the inferior pole of the left kidney concerning for a focal hypo profusion/infarct. A small accessory renal artery is noted extending from the distal aorta inferior to the aneurysm into the inferior pole of the left kidney. There is apparent hypo enhancement of the origin of this accessory artery (series 401, image 139 and coronal  series 46, image 47) which may account for hypoperfusion of this portion of the left kidney. There is no hydronephrosis on either side. There is apparent diffuse thickening of the bladder wall which may be partly related to underdistention. Cystitis is not excluded. Correlation with urinalysis recommended. The prostate gland and seminal vesicles are grossly unremarkable.  Small hiatal hernia. There is apparent thickening of the stomach and duodenum, likely related to inflammatory changes of the pancreas. There is a 4.4 x 2.7 cm duodenal diverticulum. Inflammatory changes surrounding the diverticulum are likely related to pancreatitis and less likely represent diverticulitis of the duodenum. There is extensive sigmoid and colonic diverticulosis without active inflammatory changes. No evidence of bowel  obstruction. Normal appendix.  There is aortoiliac atherosclerotic disease. The aorta is tortuous. There is an infrarenal abdominal aortic aneurysm measuring up to 6.9 cm in greatest axial dimension. This aneurysm starts approximately 4.2 cm inferior to the origins of the left renal artery and extends to the level of the bifurcation of the aorta. No definite extravasation of the contrast identified outside of the confines of the lumen of the aneurysm. Mild haziness of the periaortic fat is likely related to inflammatory changes extending from the pancreas. There is focal narrowing of the origin of the SMA with approximately 40- 50% luminal narrowing. There is atherosclerotic calcification of the ostia of the renal arteries. The origins of the celiac axis, IMA as well as the origins of the renal arteries remain patent. There is no lymphadenopathy. The visualized IVC appears unremarkable. No portal venous gas identified. The splenic vein is patent.  Fat containing left inguinal hernia without evidence of inflammation or incarceration. Degenerative changes of the spine. No acute fracture.  Review of the MIP images confirms the above findings.  IMPRESSION: Pancreatitis possibly related to gallstone. No definite stone identified in the central CBD.  Infrarenal abdominal aortic aneurysm. No evidence of extravasation of contrast identified.  Cholelithiasis.  Focal hypoenhancement of the inferior aspect of the left kidney concerning for infarct. A small accessory artery appears to extend from the inferior aorta to the lower pole of the left kidney.  The duodenal diverticulum.  Extensive colonic diverticulosis.  Critical Value/emergent results were called by telephone at the time of interpretation on 07/15/2014 at 8:59 pm to Dr. Derinda Sis , who verbally acknowledged these results.   Electronically Signed   By: Elgie Collard M.D.   On: 07/15/2014 21:50    ASSESSMENT: [place] Harps:    Atrial tach: Still with rates > 100.   Increase metoprolol to 25 mg BID.   Diuresed well yesterday.  Transfused.  Does not appear volume overloaded at this time.  Renal function stable.  Continue Lasix 40 mg daily.   Pulm edema by prior chest xray.  Chronic diastolic heart failure. Normal LVEF by echo.  CRI: improving.  Chrystie Nose, MD, [facility] Attending Cardiologist [facility]  Chrystie Nose, MD  07/19/2014  10:49 AM

## 2014-07-19 NOTE — Progress Notes (Signed)
   Name: Christian Dominguez MRN: [id number] DOB: November 08, 1921    ADMISSION DATE:  07/15/2014 CONSULTATION DATE:  07/17/2014  REFERRING MD :  Dr. Darrick Penna  CHIEF COMPLAINT:  Hypoxia  BRIEF PATIENT DESCRIPTION: 81yo M admitted with AAA repair and was found have gal stone pancreatitis, while in the hospital was noted to be desaturating and PCCM was consulted.  Patient has complained of SOB for years but has sought no medical attention and has not had his O2 sat checked, does not see doctors.  CT of the abdomen done in the hospital, pulmonary windows showed severe and chronic fibrosis.  SIGNIFICANT EVENTS  7/12: Went for CTA abd/pelv to assess AAA and found to have Cr of 2.25, BUN 41, recommend admit to hospital 7/13: ED for abdominal pain, N, V - CTA done. Surgery - EVAR for symptomatic AAA.  7/14: 1L of fluid overnight for low BP, awoke with increased hypoxia 7/15: PCCM  STUDIES:  CT abd: lower lobe cystic changes with some intralobular septal thickening and groundglass. CXR: evidence of some minor pulmonary edema but fibrosis present. TTE: Preserved systolic function with indeterminate LV filling pressure. Flattening of the LV septum with moderately dilated RV and peak pulmonary artery pressure 65 mmHg plus right atrial pressure. Trivial aortic regurgitation and mild mitral regurgitation were noted.  SUBJECTIVE:  Dyspnea unchanged.Continues to have intermittent nonproductive cough. No chest pain or pressure. Predominantly lying in bed per family.  ROS: No nausea, vomiting, or diarrhea. No headache or vision changes.  VITAL SIGNS: Temp:  [97.5 F (36.4 C)-98.1 F (36.7 C)] 98 F (36.7 C) (07/17 1136) Pulse Rate:  [55-89] 71 (07/17 0816) Resp:  [19-30] 22 (07/17 0816) BP: (102-128)/(41-63) 114/55 mmHg (07/17 0816) SpO2:  [90 %-97 %] 92 % (07/17 0816) Arterial Line BP: (103-149)/(42-61) 127/52 mmHg (07/17 0816)  PHYSICAL EXAMINATION: General:  Awake. Alert. No acute distress.  Integument:   Warm & dry. No rash on exposed skin.  HEENT: Tacky mucus membranes. No scleral injection.  Cardiovascular:  Regular rate. No edema. No appreciable JVD.  Pulmonary:  Mild basilar crackles persist. Symmetric chest wall expansion. No accessory muscle use on Ventimask. Abdomen: Soft. Normal bowel sounds. Nondistended.  Neurological:  CN 2-12 grossly in tact. No meningismus.    Recent Labs Lab 07/17/14 1605 07/18/14 0538 07/19/14 0554  NA 132* 133* 131*  K 4.3 3.7 3.4*  CL 97* 98* 94*  CO2 24 27 25   BUN 28* 30* 30*  CREATININE 2.30* 2.10* 2.20*  GLUCOSE 172* 120* 120*    Recent Labs Lab 07/17/14 0424 07/18/14 0538 07/19/14 0554  HGB 8.4* 10.3* 9.5*  HCT 24.8* 30.2* 27.7*  WBC 19.0* 19.5* 15.1*  PLT 205 223 211   No results found.  ASSESSMENT / PLAN: 1. ILD: Question was secondary to IPF versus occupational exposure. Ordering high-resolution CT scan to determine exact pattern and extensive fibrotic changes.  2. Acute hypoxic respiratory failure: Wean FiO2 for saturations greater than 92%. Agree with continuing diuresis with Lasix as renal function and hemodynamics allow.  3. Pulmonary hypertension: Suggested on transthoracic echocardiogram. Patient would not likely benefit from any pulmonary arterial vasodilator therapy as this is likely secondary to his underlying ILD.  4. Follow-up: Patient should have close follow-up as an outpatient with pulmonary upon discharge.  Donna Christen Jamison Neighbor, M.D. [facility] Pager:  [phone] After 3pm or if no response, call [phone]   07/19/2014, 1:58 PM

## 2014-07-20 ENCOUNTER — Inpatient Hospital Stay (HOSPITAL_COMMUNITY): Payer: Medicare Other | Admitting: Certified Registered Nurse Anesthetist

## 2014-07-20 ENCOUNTER — Inpatient Hospital Stay (HOSPITAL_COMMUNITY): Payer: Medicare Other

## 2014-07-20 ENCOUNTER — Encounter (HOSPITAL_COMMUNITY): Payer: Self-pay | Admitting: Certified Registered Nurse Anesthetist

## 2014-07-20 ENCOUNTER — Encounter (HOSPITAL_COMMUNITY)
Admission: EM | Disposition: A | Discharge status: Home Health | Payer: Self-pay | Source: Home / Self Care | Attending: Vascular Surgery

## 2014-07-20 DIAGNOSIS — I714 Abdominal aortic aneurysm, without rupture, unspecified: Secondary | ICD-10-CM | POA: Insufficient documentation

## 2014-07-20 DIAGNOSIS — J84112 Idiopathic pulmonary fibrosis: Secondary | ICD-10-CM

## 2014-07-20 DIAGNOSIS — Z8679 Personal history of other diseases of the circulatory system: Secondary | ICD-10-CM | POA: Insufficient documentation

## 2014-07-20 DIAGNOSIS — Z9889 Other specified postprocedural states: Secondary | ICD-10-CM

## 2014-07-20 DIAGNOSIS — I270 Primary pulmonary hypertension: Secondary | ICD-10-CM

## 2014-07-20 HISTORY — PX: CHOLECYSTECTOMY: SHX55

## 2014-07-20 LAB — BASIC METABOLIC PANEL
Anion gap: 10 (ref 5–15)
BUN: 27 mg/dL — ABNORMAL HIGH (ref 6–20)
CO2: 28 mmol/L (ref 22–32)
CREATININE: 1.9 mg/dL — AB (ref 0.61–1.24)
Calcium: 7.6 mg/dL — ABNORMAL LOW (ref 8.9–10.3)
Chloride: 93 mmol/L — ABNORMAL LOW (ref 101–111)
GFR, EST AFRICAN AMERICAN: 34 mL/min — AB (ref >60)
GFR, EST NON AFRICAN AMERICAN: 29 mL/min — AB (ref >60)
Glucose, Bld: 139 mg/dL — ABNORMAL HIGH (ref 65–99)
POTASSIUM: 3.6 mmol/L (ref 3.5–5.1)
Sodium: 131 mmol/L — ABNORMAL LOW (ref 135–145)

## 2014-07-20 LAB — CBC
HEMATOCRIT: 27.5 % — AB (ref 39.0–52.0)
HEMOGLOBIN: 9.6 g/dL — AB (ref 13.0–17.0)
MCH: 29.8 pg (ref 26.0–34.0)
MCHC: 34.9 g/dL (ref 30.0–36.0)
MCV: 85.4 fL (ref 78.0–100.0)
PLATELETS: 221 10*3/uL (ref 150–400)
RBC: 3.22 MIL/uL — AB (ref 4.22–5.81)
RDW: 13.9 % (ref 11.5–15.5)
WBC: 13.6 10*3/uL — ABNORMAL HIGH (ref 4.0–10.5)

## 2014-07-20 LAB — GLUCOSE, CAPILLARY
GLUCOSE-CAPILLARY: 145 mg/dL — AB (ref 65–99)
Glucose-Capillary: 146 mg/dL — ABNORMAL HIGH (ref 65–99)
Glucose-Capillary: 142 mg/dL — ABNORMAL HIGH (ref 65–99)
Glucose-Capillary: 140 mg/dL — ABNORMAL HIGH (ref 65–99)
Glucose-Capillary: 107 mg/dL — ABNORMAL HIGH (ref 65–99)

## 2014-07-20 LAB — LIPASE, BLOOD: LIPASE: 40 U/L (ref 22–51)

## 2014-07-20 SURGERY — LAPAROSCOPIC CHOLECYSTECTOMY WITH INTRAOPERATIVE CHOLANGIOGRAM
Anesthesia: General | Site: Abdomen

## 2014-07-20 MED ORDER — OXYCODONE HCL 5 MG/5ML PO SOLN: 5.0000 mg | Freq: Once | ORAL | Status: DC | PRN

## 2014-07-20 MED ORDER — 0.9 % SODIUM CHLORIDE (POUR BTL) OPTIME
TOPICAL | Status: DC | PRN
  Administered 2014-07-20: 1000 mL

## 2014-07-20 MED ORDER — PROPOFOL 10 MG/ML IV BOLUS
INTRAVENOUS | Status: DC | PRN
  Administered 2014-07-20: 20 mg via INTRAVENOUS
  Administered 2014-07-20: 50 mg via INTRAVENOUS

## 2014-07-20 MED ORDER — ONDANSETRON HCL 4 MG/2ML IJ SOLN: 4.0000 mg | Freq: Once | INTRAMUSCULAR | Status: DC | PRN

## 2014-07-20 MED ORDER — CEFAZOLIN SODIUM-DEXTROSE 2-3 GM-% IV SOLR
2.0000 g | Freq: Once | INTRAVENOUS | Status: AC
  Administered 2014-07-20: 2 g via INTRAVENOUS
  Filled 2014-07-20: qty 50

## 2014-07-20 MED ORDER — HYDROCODONE-ACETAMINOPHEN 5-325 MG PO TABS: 1.0000 | ORAL_TABLET | ORAL | Status: DC | PRN

## 2014-07-20 MED ORDER — GLYCOPYRROLATE 0.2 MG/ML IJ SOLN
INTRAMUSCULAR | Status: DC | PRN
  Administered 2014-07-20: 0.6 mg via INTRAVENOUS

## 2014-07-20 MED ORDER — SODIUM CHLORIDE 0.9 % IV SOLN
INTRAVENOUS | Status: DC | PRN
  Administered 2014-07-20: 14 mL

## 2014-07-20 MED ORDER — FENTANYL CITRATE (PF) 100 MCG/2ML IJ SOLN
INTRAMUSCULAR | Status: AC
  Filled 2014-07-20: qty 2

## 2014-07-20 MED ORDER — SCOPOLAMINE 1 MG/3DAYS TD PT72
MEDICATED_PATCH | TRANSDERMAL | Status: AC
  Filled 2014-07-20: qty 1

## 2014-07-20 MED ORDER — POTASSIUM CHLORIDE 10 MEQ/50ML IV SOLN
10.0000 meq | INTRAVENOUS | Status: AC
  Administered 2014-07-20 (×2): 10 meq via INTRAVENOUS
  Filled 2014-07-20: qty 50

## 2014-07-20 MED ORDER — NEOSTIGMINE METHYLSULFATE 10 MG/10ML IV SOLN
INTRAVENOUS | Status: DC | PRN
  Administered 2014-07-20: 4 mg via INTRAVENOUS

## 2014-07-20 MED ORDER — ROCURONIUM BROMIDE 100 MG/10ML IV SOLN
INTRAVENOUS | Status: DC | PRN
  Administered 2014-07-20: 30 mg via INTRAVENOUS

## 2014-07-20 MED ORDER — MORPHINE SULFATE 4 MG/ML IJ SOLN
INTRAMUSCULAR | Status: AC
  Administered 2014-07-20: 2 mg
  Filled 2014-07-20: qty 1

## 2014-07-20 MED ORDER — LIDOCAINE HCL (CARDIAC) 20 MG/ML IV SOLN
INTRAVENOUS | Status: DC | PRN
  Administered 2014-07-20: 40 mg via INTRAVENOUS

## 2014-07-20 MED ORDER — BUPIVACAINE-EPINEPHRINE 0.25% -1:200000 IJ SOLN
INTRAMUSCULAR | Status: DC | PRN
  Administered 2014-07-20: 15 mL

## 2014-07-20 MED ORDER — BUPIVACAINE-EPINEPHRINE (PF) 0.25% -1:200000 IJ SOLN
INTRAMUSCULAR | Status: AC
  Filled 2014-07-20: qty 30

## 2014-07-20 MED ORDER — PHENYLEPHRINE HCL 10 MG/ML IJ SOLN
INTRAMUSCULAR | Status: DC | PRN
  Administered 2014-07-20 (×3): 80 ug via INTRAVENOUS

## 2014-07-20 MED ORDER — FENTANYL CITRATE (PF) 250 MCG/5ML IJ SOLN
INTRAMUSCULAR | Status: AC
  Filled 2014-07-20: qty 5

## 2014-07-20 MED ORDER — SODIUM CHLORIDE 0.9 % IR SOLN
Status: DC | PRN
Start: 2014-07-20 — End: 2014-07-20
  Administered 2014-07-20: 1

## 2014-07-20 MED ORDER — OXYCODONE HCL 5 MG PO TABS: 5.0000 mg | ORAL_TABLET | Freq: Once | ORAL | Status: DC | PRN

## 2014-07-20 MED ORDER — FENTANYL CITRATE (PF) 100 MCG/2ML IJ SOLN
INTRAMUSCULAR | Status: DC | PRN
  Administered 2014-07-20: 50 ug via INTRAVENOUS
  Administered 2014-07-20: 100 ug via INTRAVENOUS

## 2014-07-20 MED ORDER — FENTANYL CITRATE (PF) 100 MCG/2ML IJ SOLN
25.0000 ug | INTRAMUSCULAR | Status: DC | PRN
  Administered 2014-07-20 (×2): 25 ug via INTRAVENOUS

## 2014-07-20 MED ORDER — ARTIFICIAL TEARS OP OINT
TOPICAL_OINTMENT | OPHTHALMIC | Status: DC | PRN
Start: 2014-07-20 — End: 2014-07-20
  Administered 2014-07-20: 1 via OPHTHALMIC

## 2014-07-20 MED ORDER — LACTATED RINGERS IV SOLN
INTRAVENOUS | Status: DC
  Administered 2014-07-20 (×2): via INTRAVENOUS

## 2014-07-20 MED ORDER — EPHEDRINE SULFATE 50 MG/ML IJ SOLN
INTRAMUSCULAR | Status: DC | PRN
  Administered 2014-07-20: 10 mg via INTRAVENOUS

## 2014-07-20 SURGICAL SUPPLY — 45 items
APPLIER CLIP 5 13 M/L LIGAMAX5 (MISCELLANEOUS) ×3
BLADE SURG ROTATE 9660 (MISCELLANEOUS) IMPLANT
CANISTER SUCTION 2500CC (MISCELLANEOUS) ×3 IMPLANT
CHLORAPREP W/TINT 26ML (MISCELLANEOUS) ×3 IMPLANT
CLIP APPLIE 5 13 M/L LIGAMAX5 (MISCELLANEOUS) ×1 IMPLANT
COVER MAYO STAND STRL (DRAPES) ×3 IMPLANT
COVER SURGICAL LIGHT HANDLE (MISCELLANEOUS) ×3 IMPLANT
DERMABOND ADVANCED (GAUZE/BANDAGES/DRESSINGS) ×2
DERMABOND ADVANCED .7 DNX12 (GAUZE/BANDAGES/DRESSINGS) ×1 IMPLANT
DRAPE C-ARM 42X72 X-RAY (DRAPES) ×3 IMPLANT
ELECT REM PT RETURN 9FT ADLT (ELECTROSURGICAL) ×3
ELECTRODE REM PT RTRN 9FT ADLT (ELECTROSURGICAL) ×1 IMPLANT
FILTER SMOKE EVAC LAPAROSHD (FILTER) IMPLANT
GLOVE BIO SURGEON STRL SZ 6.5 (GLOVE) ×2 IMPLANT
GLOVE BIO SURGEON STRL SZ8 (GLOVE) ×3 IMPLANT
GLOVE BIO SURGEONS STRL SZ 6.5 (GLOVE) ×1
GLOVE BIOGEL PI IND STRL 7.0 (GLOVE) ×3 IMPLANT
GLOVE BIOGEL PI IND STRL 8 (GLOVE) ×1 IMPLANT
GLOVE BIOGEL PI INDICATOR 7.0 (GLOVE) ×6
GLOVE BIOGEL PI INDICATOR 8 (GLOVE) ×2
GLOVE SURG SS PI 6.5 STRL IVOR (GLOVE) ×3 IMPLANT
GOWN STRL REUS W/ TWL LRG LVL3 (GOWN DISPOSABLE) ×2 IMPLANT
GOWN STRL REUS W/ TWL XL LVL3 (GOWN DISPOSABLE) ×1 IMPLANT
GOWN STRL REUS W/TWL LRG LVL3 (GOWN DISPOSABLE) ×4
GOWN STRL REUS W/TWL XL LVL3 (GOWN DISPOSABLE) ×2
KIT BASIN OR (CUSTOM PROCEDURE TRAY) ×3 IMPLANT
KIT ROOM TURNOVER OR (KITS) ×3 IMPLANT
LIQUID BAND (GAUZE/BANDAGES/DRESSINGS) ×3 IMPLANT
NEEDLE 22X1 1/2 (OR ONLY) (NEEDLE) ×3 IMPLANT
NS IRRIG 1000ML POUR BTL (IV SOLUTION) ×3 IMPLANT
PAD ARMBOARD 7.5X6 YLW CONV (MISCELLANEOUS) ×3 IMPLANT
POUCH RETRIEVAL ECOSAC 10 (ENDOMECHANICALS) ×1 IMPLANT
POUCH RETRIEVAL ECOSAC 10MM (ENDOMECHANICALS) ×2
SCISSORS LAP 5X35 DISP (ENDOMECHANICALS) ×3 IMPLANT
SET CHOLANGIOGRAPH 5 50 .035 (SET/KITS/TRAYS/PACK) ×3 IMPLANT
SET IRRIG TUBING LAPAROSCOPIC (IRRIGATION / IRRIGATOR) ×3 IMPLANT
SLEEVE ENDOPATH XCEL 5M (ENDOMECHANICALS) ×6 IMPLANT
SPECIMEN JAR SMALL (MISCELLANEOUS) ×3 IMPLANT
SUT VIC AB 4-0 PS2 27 (SUTURE) ×3 IMPLANT
TOWEL OR 17X24 6PK STRL BLUE (TOWEL DISPOSABLE) ×3 IMPLANT
TOWEL OR 17X26 10 PK STRL BLUE (TOWEL DISPOSABLE) ×3 IMPLANT
TRAY LAPAROSCOPIC MC (CUSTOM PROCEDURE TRAY) ×3 IMPLANT
TROCAR XCEL BLUNT TIP 100MML (ENDOMECHANICALS) ×3 IMPLANT
TROCAR XCEL NON-BLD 5MMX100MML (ENDOMECHANICALS) ×3 IMPLANT
TUBING INSUFFLATION (TUBING) ×3 IMPLANT

## 2014-07-20 NOTE — Op Note (Signed)
07/15/2014 - 07/20/2014  12:56 PM  PATIENT:  Christian Dominguez  81 y.o. male  PRE-OPERATIVE DIAGNOSIS:  Gallstones  POST-OPERATIVE DIAGNOSIS:  Gallstones  PROCEDURE:  Procedure(s): LAPAROSCOPIC CHOLECYSTECTOMY WITH INTRAOPERATIVE CHOLANGIOGRAM  SURGEON:  Surgeon(s): Violeta Gelinas, MD  ASSISTANTS: Anette Riedel [place], ARNP   ANESTHESIA:   local and general  EBL:  Total I/O In: 1000 [I.V.:1000] Out: 150 [Urine:150]  BLOOD ADMINISTERED:none  DRAINS: none   SPECIMEN:  Excision  DISPOSITION OF SPECIMEN:  PATHOLOGY  COUNTS:  YES  DICTATION: .Dragon Dictation [name] is brought for laparoscopic cholecystectomy. Pulmonary service and cardiology services have been following him to optimize him through the weekend. Informed consent was obtained. He was identified in the preop holding area. He received intravenous antibiotics. He was brought to the operating room and general endotracheal anesthesia was administered by the anesthesia staff. Abdomen was prepped and draped in sterile fashion. Timeout procedure was performed.The infraumbilical region was infiltrated with local. Infraumbilical incision was made. Subcutaneous tissues were dissected down revealing the anterior fascia. This was divided sharply along the midline. Peritoneal cavity was entered under direct vision without complication. A 0 Vicryl pursestring was placed around the fascial opening. Hassan trocar was inserted into the abdomen. The abdomen was insufflated with carbon dioxide in standard fashion. Under direct vision a 5 mm epigastric and 5 mm right lateral ports 2 were placed. Local was used at each port site. Laparoscopic exploration revealed the gallbladder was not severely inflamed. The dome was retracted superior medially. The infundibulum was retracted inferior laterally. Dissection began laterally and progressed medially identifying the cystic duct. Cystic duct was generous in size. Dissection continued until we had a critical view  between the cystic duct, the manubrium, and the liver. A clip was placed on the infundibular cystic duct junction. A small nick was made in the cystic duct and a cholangiocatheter was inserted. Intraoperative cringed and was obtained demonstrated that while the common bile duct was generous in size, there were no filling defects and contrast flowed easily into the duodenum. Cholangiocatheter was removed and 3 clips were placed proximally on the cystic duct and it was divided. Further dissection revealed an anterior branch of the cystic artery. This was clipped twice proximally and divided. Gallbladder was taken off the liver bed with Bovie cautery. We did encounter a posterior branch of the cystic artery. This was clipped twice proximally and divided. Gallbladder was taken the rest of the way off the liver bed and placed in a bag and removed from the infra umbilical port site. The liver bed was then cauterized achieving excellent hemostasis. The area was copiously irrigated. Irrigation returned clear. There was good hemostasis. Clips remain in excellent position. Ports were then removed under direct vision. Pneumoperitoneum was released. All 4 wounds were irrigated. The infra umbilical fascia was closed by tying the pursestring. All 4 wounds were then closed with running 4 Vicryl subcuticular followed by Dermabond. All counts were correct. Patient tolerated procedure well without apparent complication & was taken recovery in stable condition. PATIENT DISPOSITION:  PACU - hemodynamically stable.   Delay start of Pharmacological VTE agent (>24hrs) due to surgical blood loss or risk of bleeding:  no  Violeta Gelinas, MD, MPH, FACS Pager: [phone]  7/18/201612:56 PM

## 2014-07-20 NOTE — Clinical Documentation Improvement (Signed)
Supporting Information: Chronic renal insufficiency improving per 7/16 progress notes.      (CRI codes to chronic kidney disease, unspecified)  Please document the stage, if known, of chronic kidney disease.    Labs:   Bun    Creat      GFR: 7/18:     27        1.90        29  7/17:     30        2.20        25   . Document the stage of CKD --Chronic kidney disease, stage 1- GFR > OR = 90 --Chronic kidney disease, stage 2 (mild) - GFR 60-89 --Chronic kidney disease, stage 3 (moderate) - GFR 30-59 --Chronic kidney disease, stage 4 (severe) - GFR 15-29 --Chronic kidney disease, stage 5- GFR < 15 --End-stage renal disease (ESRD) . Document any underlying cause of CKD such as Diabetes or Hypertension . Document if the patient is dependent on Dialysis . Chronic renal failure without a documented stage will be assigned to Chronic kidney disease, unspecified . Document any associated diagnoses/conditions     Thank Gabriel Cirri Documentation Specialist [phone] [email or web address]

## 2014-07-20 NOTE — Progress Notes (Signed)
UR COMPLETED  

## 2014-07-20 NOTE — Progress Notes (Signed)
SUBJECTIVE:  Looks miserable. Wants to proceed with surgery  OBJECTIVE:   Vitals:   Filed Vitals:   07/19/14 2331 07/20/14 0424 07/20/14 0700 07/20/14 0722  BP: 126/61 122/58  125/60  Pulse: 72 73  72  Temp:  98.3 F (36.8 C) 98.1 F (36.7 C)   TempSrc:  Axillary Oral   Resp: Height:      Weight:      SpO2: 92% 94%  92%   I&O's:    Intake/Output Summary (Last 24 hours) at 07/20/14 1100 Last data filed at 07/20/14 0900  Gross per 24 hour  Intake      0 ml  Output   1100 ml  Net  -1100 ml   TELEMETRY: Reviewed telemetry pt in intermittent short runs atrial tach, PAF this am  PHYSICAL EXAM General: Well developed, well nourished, in no acute distress Head:   Normal cephalic and atramatic  Lungs:  Coarse Breath sounds bilaterally to auscultation, dry crackles. Heart:  irregular S1 S2  No JVD.   Abdomen: abdomen soft and non-tender Msk:  Back normal,  Normal strength and tone for age. Extremities:   No edema.   Neuro: Alert and oriented. Psych:  Normal affect, responds appropriately Skin: No rash   LABS: Basic Metabolic Panel:  Recent Labs  16/10/96 0554 07/20/14 0440  NA 131* 131*  K 3.4* 3.6  CL 94* 93*  CO2 25 28  GLUCOSE 120* 139*  BUN 30* 27*  CREATININE 2.20* 1.90*  CALCIUM 7.9* 7.6*   Liver Function Tests: No results for input(s): AST, ALT, ALKPHOS, BILITOT, PROT, ALBUMIN in the last 72 hours.  Recent Labs  07/19/14 0554 07/20/14 0440  LIPASE 28 40   CBC:  Recent Labs  07/19/14 0554 07/20/14 0440  WBC 15.1* 13.6*  HGB 9.5* 9.6*  HCT 27.7* 27.5*  MCV 85.8 85.4  PLT 211 221   Cardiac Enzymes: No results for input(s): CKTOTAL, CKMB, CKMBINDEX, TROPONINI in the last 72 hours. BNP: Invalid input(s): POCBNP D-Dimer: No results for input(s): DDIMER in the last 72 hours. Hemoglobin A1C: No results for input(s): HGBA1C in the last 72 hours. Fasting Lipid Panel: No results for input(s): CHOL, HDL, LDLCALC, TRIG, CHOLHDL,  LDLDIRECT in the last 72 hours. Thyroid Function Tests: No results for input(s): TSH, T4TOTAL, T3FREE, THYROIDAB in the last 72 hours.  Invalid input(s): FREET3 Anemia Panel: No results for input(s): VITAMINB12, FOLATE, FERRITIN, TIBC, IRON, RETICCTPCT in the last 72 hours. Coag Panel:   Lab Results  Component Value Date   INR 1.21 07/16/2014    RADIOLOGY: Ct Chest High Resolution  07/20/2014   CLINICAL DATA:  81 year old male with shortness of breath. History of interstitial lung disease. History of stent graft repair for abdominal aortic aneurysm. Awaiting cholecystectomy.  EXAM: CT CHEST WITHOUT CONTRAST  TECHNIQUE: Multidetector CT imaging of the chest was performed following the standard protocol without intravenous contrast. High resolution imaging of the lungs, as well as inspiratory and expiratory imaging, was performed.  COMPARISON:  No priors.  FINDINGS: Mediastinum/Lymph Nodes: Heart size is mildly enlarged. There is no significant pericardial fluid, thickening or pericardial calcification. There is atherosclerosis of the thoracic aorta, the great vessels of the mediastinum and the coronary arteries, including calcified atherosclerotic plaque in the left main, left anterior descending, left circumflex and right coronary arteries. Dilatation of the pulmonic trunk (4.2 cm in diameter), and the main pulmonary arteries, suggestive of pulmonary arterial hypertension. Multiple prominent borderline and mildly  enlarged mediastinal and bilateral hilar lymph nodes measuring up to 1.5 cm in short axis in the low right paratracheal station. Esophagus is unremarkable in appearance. No axillary lymphadenopathy.  Lungs/Pleura: Throughout the lungs bilaterally there are extensive areas of subpleural and peribronchovascular reticulation with associated traction bronchiectasis and areas of frank honeycombing, with a definitive craniocaudal gradient. Additionally, predominantly in the mid to upper lungs there  is concurrent ground-glass attenuation, likely to reflect some underlying pulmonary edema. Inspiratory and expiratory imaging is unremarkable. Small left pleural effusion layering dependently.  Upper Abdomen: Numerous calcified gallstones within the gallbladder. Gallbladder wall appears mildly thickened (4 mm), but there are no overt surrounding inflammatory changes in the visualized upper abdomen at this time to strongly suggest presence of an acute cholecystitis.  Musculoskeletal/Soft Tissues: There are no aggressive appearing lytic or blastic lesions noted in the visualized portions of the skeleton.  IMPRESSION: 1. The appearance of the lungs is compatible with interstitial lung disease, and the spectrum of findings is considered diagnostic of usual interstitial pneumonia (UIP). In addition, mid to upper lung predominant ground-glass attenuation is favored to reflect superimposed pulmonary edema. The possibility of acute infection/ inflammation in these regions is not excluded, but is not strongly favored. 2. Dilatation of the pulmonic trunk and main pulmonary arteries, suggestive of pulmonary arterial hypertension. 3. Atherosclerosis, including left main and 3 vessel coronary artery disease. Please note that although the presence of coronary artery calcium documents the presence of coronary artery disease, the severity of this disease and any potential stenosis cannot be assessed on this non-gated CT examination. Assessment for potential risk factor modification, dietary therapy or pharmacologic therapy may be warranted, if clinically indicated. 4. Numerous borderline enlarged and mildly enlarged mediastinal and hilar lymph nodes, presumably reactive in the setting of interstitial lung disease. 5. Cholelithiasis.   Electronically Signed   By: Trudie Reed M.D.   On: 07/20/2014 08:37   Dg Chest Port 1 View  07/17/2014   CLINICAL DATA:  Shortness of Breath  EXAM: PORTABLE CHEST - 1 VIEW  COMPARISON:  July 16, 2014  FINDINGS: Central catheter tip is in the superior vena cava. No pneumothorax. There is widespread fibrotic type change in the lungs. There may well be superimposed interstitial edema. There is no airspace consolidation. Heart is upper normal in size with evidence of a degree of pulmonary venous hypertension. No adenopathy appreciable.  IMPRESSION: The appearance is indicative of a degree of congestive heart failure superimposed on chronic interstitial fibrosis. No airspace consolidation. No pneumothorax.   Electronically Signed   By: Bretta Bang III M.D.   On: 07/17/2014 07:40   Dg Chest Port 1 View  07/16/2014   CLINICAL DATA:  Status post abdominal aortic aneurysm is arm repair ; history of diabetes.  EXAM: PORTABLE CHEST - 1 VIEW  COMPARISON:  Portable chest x-ray of July 16, 2014  FINDINGS: There remains elevation of the right hemidiaphragm. The interstitial markings of both lungs remain diffusely increased but may have slightly improved since yesterday's study. The cardiac silhouette remains enlarged. The pulmonary vascularity is less engorged. The right internal jugular Cordis sheath tip projects over the proximal portion of the SVC. There is no pleural effusion or pneumothorax. The observed bony thorax is unremarkable.  IMPRESSION: Slight interval improvement in CHF and bilateral pulmonary interstitial edema. Underlying pulmonary fibrotic changes.   Electronically Signed   By: David  [place] M.D.   On: 07/16/2014 07:58   Dg Chest Port 1 View  07/16/2014  CLINICAL DATA:  Central line placement  EXAM: PORTABLE CHEST - 1 VIEW  COMPARISON:  None.  FINDINGS: Right central venous catheter with tip projecting over the low SVC. No pneumothorax. Normal heart size. Low lung volumes. Diffuse interstitial pattern to both lungs. Correlation with prior CT abdomen and pelvis demonstrates this to represent diffuse parenchymal fibrosis with cystic honeycomb changes. No blunting of costophrenic angles. No  pneumothorax.  IMPRESSION: Right central venous catheter appears to be in satisfactory position. Diffuse coarse interstitial infiltration throughout both lungs with low lung volumes consistent with chronic fibrosis.   Electronically Signed   By: Burman Nieves M.D.   On: 07/16/2014 01:28   Dg Abd Portable 1v  07/16/2014   CLINICAL DATA:  Post abdominal aortic aneurysm surgery.  EXAM: PORTABLE ABDOMEN - 1 VIEW  COMPARISON:  CT abdomen and pelvis 07/15/2014  FINDINGS: Aorto bi-iliac stent graft is present. Bowel gas pattern is normal. Coarse infiltrates in the lung bases suggesting fibrosis.  IMPRESSION: Aorto bi-iliac stent graft. Normal bowel gas pattern. Fibrosis in the lung bases.   Electronically Signed   By: Burman Nieves M.D.   On: 07/16/2014 04:07   Ct Angio Abd/pel W/ And/or W/o  07/15/2014   CLINICAL DATA:  81 year old male with generalized abdominal pain, nausea and vomiting. History of gallstone and abdominal aortic aneurysm.  EXAM: CTA ABDOMEN AND PELVIS wITHOUT AND WITH CONTRAST  TECHNIQUE: Multidetector CT imaging of the abdomen and pelvis was performed using the standard protocol during bolus administration of intravenous contrast. Multiplanar reconstructed images and MIPs were obtained and reviewed to evaluate the vascular anatomy.  CONTRAST:  OMNIPAQUE IOHEXOL 350 MG/ML SOLN  COMPARISON:  None.  FINDINGS: There are emphysematous changes with diffuse ground-glass appearance of the visualized lung bases. There is coronary vascular calcification. Top-normal cardiac size.  No intra-abdominal free air no free fluid.  There is focal heterogeneous uterine enhancement in the right lobe of the liver adjacent to the gallbladder. No definite lesion of identified. Subcentimeter scattered hypodense lesions are too small to characterize. There are multiple stones within the gallbladder. Trace pericholecystic fluid may be present. The common bile duct is normal in caliber. There is diffuse  inflammatory changes and edema of the pancreas compatible with pancreatitis. No drainable fluid collection/abscess or pseudocyst identified. The spleen demonstrates a lobulated contour otherwise unremarkable. The adrenal glands appear unremarkable. The right kidney appears unremarkable. There is a focal area of hypoenhancement involving the inferior pole of the left kidney concerning for a focal hypo profusion/infarct. A small accessory renal artery is noted extending from the distal aorta inferior to the aneurysm into the inferior pole of the left kidney. There is apparent hypo enhancement of the origin of this accessory artery (series 401, image 139 and coronal series 46, image 47) which may account for hypoperfusion of this portion of the left kidney. There is no hydronephrosis on either side. There is apparent diffuse thickening of the bladder wall which may be partly related to underdistention. Cystitis is not excluded. Correlation with urinalysis recommended. The prostate gland and seminal vesicles are grossly unremarkable.  Small hiatal hernia. There is apparent thickening of the stomach and duodenum, likely related to inflammatory changes of the pancreas. There is a 4.4 x 2.7 cm duodenal diverticulum. Inflammatory changes surrounding the diverticulum are likely related to pancreatitis and less likely represent diverticulitis of the duodenum. There is extensive sigmoid and colonic diverticulosis without active inflammatory changes. No evidence of bowel obstruction. Normal appendix.  There is aortoiliac  atherosclerotic disease. The aorta is tortuous. There is an infrarenal abdominal aortic aneurysm measuring up to 6.9 cm in greatest axial dimension. This aneurysm starts approximately 4.2 cm inferior to the origins of the left renal artery and extends to the level of the bifurcation of the aorta. No definite extravasation of the contrast identified outside of the confines of the lumen of the aneurysm. Mild  haziness of the periaortic fat is likely related to inflammatory changes extending from the pancreas. There is focal narrowing of the origin of the SMA with approximately 40- 50% luminal narrowing. There is atherosclerotic calcification of the ostia of the renal arteries. The origins of the celiac axis, IMA as well as the origins of the renal arteries remain patent. There is no lymphadenopathy. The visualized IVC appears unremarkable. No portal venous gas identified. The splenic vein is patent.  Fat containing left inguinal hernia without evidence of inflammation or incarceration. Degenerative changes of the spine. No acute fracture.  Review of the MIP images confirms the above findings.  IMPRESSION: Pancreatitis possibly related to gallstone. No definite stone identified in the central CBD.  Infrarenal abdominal aortic aneurysm. No evidence of extravasation of contrast identified.  Cholelithiasis.  Focal hypoenhancement of the inferior aspect of the left kidney concerning for infarct. A small accessory artery appears to extend from the inferior aorta to the lower pole of the left kidney.  The duodenal diverticulum.  Extensive colonic diverticulosis.  Critical Value/emergent results were called by telephone at the time of interpretation on 07/15/2014 at 8:59 pm to Dr. Derinda Sis , who verbally acknowledged these results.   Electronically Signed   By: Elgie Collard M.D.   On: 07/15/2014 21:50    ASSESSMENT: Christian Dominguez:    Atrial tach: Still with rates > 100. Lopressor increased yesterday. I/O negative 4.2L total. B/P stable by cuff.  Chronic diastolic heart failure. Normal LVEF by echo.  Plan: Dr Rennis Golden to review.   Abelino Derrick, PA-C  07/20/2014  11:00 AM

## 2014-07-20 NOTE — Anesthesia Procedure Notes (Signed)
Procedure Name: Intubation Date/Time: 07/20/2014 12:06 PM Performed by: Willeen Cass P Pre-anesthesia Checklist: Patient identified, Emergency Drugs available, Suction available, Patient being monitored and Timeout performed Patient Re-evaluated:Patient Re-evaluated prior to inductionOxygen Delivery Method: Circle system utilized Preoxygenation: Pre-oxygenation with 100% oxygen Intubation Type: IV induction Ventilation: Mask ventilation without difficulty and Oral airway inserted - appropriate to patient size Laryngoscope Size: Mac and 4 Grade View: Grade I Tube type: Oral Tube size: 7.5 mm Number of attempts: 1 Airway Equipment and Method: Stylet and LTA kit utilized Placement Confirmation: ETT inserted through vocal cords under direct vision,  breath sounds checked- equal and bilateral and positive ETCO2 Secured at: 22 cm Tube secured with: Tape Dental Injury: Teeth and Oropharynx as per pre-operative assessment

## 2014-07-20 NOTE — Progress Notes (Signed)
Name: Christian Dominguez MRN: [id number] DOB: 01/01/1922    ADMISSION DATE:  07/15/2014 CONSULTATION DATE:  07/17/2014  REFERRING MD :  Dr. Darrick Penna  CHIEF COMPLAINT:  Hypoxia  BRIEF PATIENT DESCRIPTION: 81yo M admitted with AAA repair and was found have gal stone pancreatitis, while in the hospital was noted to be desaturating and PCCM was consulted.  Patient has complained of SOB for years but has sought no medical attention and has not had his O2 sat checked, does not see doctors.  CT of the abdomen done in the hospital, pulmonary windows showed severe and chronic fibrosis.  SIGNIFICANT EVENTS  7/12: Went for CTA abd/pelv to assess AAA and found to have Cr of 2.25, BUN 41, recommend admit to hospital 7/13: ED for abdominal pain, N, V - CTA done. Surgery - EVAR for symptomatic AAA.  7/14: 1L of fluid overnight for low BP, awoke with increased hypoxia 7/15: PCCM  STUDIES:  CT abd: lower lobe cystic changes with some intralobular septal thickening and groundglass. CXR: evidence of some minor pulmonary edema but fibrosis present. TTE: Preserved systolic function with indeterminate LV filling pressure. Flattening of the LV septum with moderately dilated RV and peak pulmonary artery pressure 65 mmHg plus right atrial pressure. Trivial aortic regurgitation and mild mitral regurgitation were noted.  SUBJECTIVE:  On venti mask - 4L C/o intermittent nonproductive cough. No chest pain or pressure.  ROS: No nausea, vomiting, or diarrhea. No headache or vision changes.  VITAL SIGNS: Temp:  [97.8 F (36.6 C)-98.6 F (37 C)] 98.1 F (36.7 C) (07/18 0700) Pulse Rate:  [57-73] 72 (07/18 0722) Resp:  [22-28] 25 (07/18 0722) BP: (116-139)/(58-75) 125/60 mmHg (07/18 0722) SpO2:  [91 %-96 %] 92 % (07/18 0722) Arterial Line BP: (130-149)/(53-63) 140/56 mmHg (07/18 0722) FiO2 (%):  [35 %] 35 % (07/17 1935)  PHYSICAL EXAMINATION: General:  Awake. Alert. No acute distress.  Integument:  Warm & dry. No  rash on exposed skin.  HEENT: Tacky mucus membranes. No scleral injection.  Cardiovascular:  Regular rate. No edema. No appreciable JVD.  Pulmonary:coarse 'velcro' basilar crackles persist. Symmetric chest wall expansion. No accessory muscle use on Ventimask. Abdomen: Soft. Normal bowel sounds. Nondistended.  Neurological: alert, interactive,grossly non focal   Recent Labs Lab 07/18/14 0538 07/19/14 0554 07/20/14 0440  NA 133* 131* 131*  K 3.7 3.4* 3.6  CL 98* 94* 93*  CO2 27 25 28   BUN 30* 30* 27*  CREATININE 2.10* 2.20* 1.90*  GLUCOSE 120* 120* 139*    Recent Labs Lab 07/18/14 0538 07/19/14 0554 07/20/14 0440  HGB 10.3* 9.5* 9.6*  HCT 30.2* 27.7* 27.5*  WBC 19.5* 15.1* 13.6*  PLT 223 211 221   Ct Chest High Resolution  07/20/2014   CLINICAL DATA:  81 year old male with shortness of breath. History of interstitial lung disease. History of stent graft repair for abdominal aortic aneurysm. Awaiting cholecystectomy.  EXAM: CT CHEST WITHOUT CONTRAST  TECHNIQUE: Multidetector CT imaging of the chest was performed following the standard protocol without intravenous contrast. High resolution imaging of the lungs, as well as inspiratory and expiratory imaging, was performed.  COMPARISON:  No priors.  FINDINGS: Mediastinum/Lymph Nodes: Heart size is mildly enlarged. There is no significant pericardial fluid, thickening or pericardial calcification. There is atherosclerosis of the thoracic aorta, the great vessels of the mediastinum and the coronary arteries, including calcified atherosclerotic plaque in the left main, left anterior descending, left circumflex and right coronary arteries. Dilatation of the pulmonic trunk (4.2 cm in  diameter), and the main pulmonary arteries, suggestive of pulmonary arterial hypertension. Multiple prominent borderline and mildly enlarged mediastinal and bilateral hilar lymph nodes measuring up to 1.5 cm in short axis in the low right paratracheal station.  Esophagus is unremarkable in appearance. No axillary lymphadenopathy.  Lungs/Pleura: Throughout the lungs bilaterally there are extensive areas of subpleural and peribronchovascular reticulation with associated traction bronchiectasis and areas of frank honeycombing, with a definitive craniocaudal gradient. Additionally, predominantly in the mid to upper lungs there is concurrent ground-glass attenuation, likely to reflect some underlying pulmonary edema. Inspiratory and expiratory imaging is unremarkable. Small left pleural effusion layering dependently.  Upper Abdomen: Numerous calcified gallstones within the gallbladder. Gallbladder wall appears mildly thickened (4 mm), but there are no overt surrounding inflammatory changes in the visualized upper abdomen at this time to strongly suggest presence of an acute cholecystitis.  Musculoskeletal/Soft Tissues: There are no aggressive appearing lytic or blastic lesions noted in the visualized portions of the skeleton.  IMPRESSION: 1. The appearance of the lungs is compatible with interstitial lung disease, and the spectrum of findings is considered diagnostic of usual interstitial pneumonia (UIP). In addition, mid to upper lung predominant ground-glass attenuation is favored to reflect superimposed pulmonary edema. The possibility of acute infection/ inflammation in these regions is not excluded, but is not strongly favored. 2. Dilatation of the pulmonic trunk and main pulmonary arteries, suggestive of pulmonary arterial hypertension. 3. Atherosclerosis, including left main and 3 vessel coronary artery disease. Please note that although the presence of coronary artery calcium documents the presence of coronary artery disease, the severity of this disease and any potential stenosis cannot be assessed on this non-gated CT examination. Assessment for potential risk factor modification, dietary therapy or pharmacologic therapy may be warranted, if clinically indicated. 4.  Numerous borderline enlarged and mildly enlarged mediastinal and hilar lymph nodes, presumably reactive in the setting of interstitial lung disease. 5. Cholelithiasis.   Electronically Signed   By: Trudie Reed M.D.   On: 07/20/2014 08:37    ASSESSMENT / PLAN: 1. ILD: Favor IPF based on  high-resolution CT scan - extensive fibrotic changes.  2. Acute hypoxic respiratory failure: Wean FiO2 for saturations greater than 92%.  3. Pulmonary hypertension: Suggested on transthoracic echocardiogram. Patient would not likely benefit from any pulmonary arterial vasodilator therapy as this is likely secondary to his underlying ILD.  4. Follow-up: Patient should have close follow-up as an outpatient with pulmonary upon discharge.  He is mod -high risk for post op pulm complications based on above. Surprisingly, pre-op functional capacity was high  Cyril Mourning MD. FCCP. [facility] Pager [phone] If no response call [phone]   07/20/2014, 9:01 AM

## 2014-07-20 NOTE — Transfer of Care (Signed)
Immediate Anesthesia Transfer of Care Note  Patient: Christian Dominguez  Procedure(s) Performed: Procedure(s): LAPAROSCOPIC CHOLECYSTECTOMY WITH INTRAOPERATIVE CHOLANGIOGRAM (N/A)  Patient Location: PACU  Anesthesia Type:General  Level of Consciousness: awake, alert , oriented and patient cooperative  Airway & Oxygen Therapy: Patient Spontanous Breathing and Patient connected to face mask oxygen  Post-op Assessment: Report given to RN, Post -op Vital signs reviewed and stable and Patient moving all extremities  Post vital signs: Reviewed and stable  Last Vitals:  Filed Vitals:   07/20/14 0722  BP: 125/60  Pulse: 72  Temp:   Resp: 25    Complications: No apparent anesthesia complications

## 2014-07-20 NOTE — Care Management Important Message (Signed)
Important Message  Patient Details  Name: Christian Dominguez MRN: [id number] Date of Birth: 26-Aug-1921   Medicare Important Message Given:  Yes-second notification given    Orson Aloe 07/20/2014, 1:37 PM

## 2014-07-20 NOTE — Progress Notes (Signed)
5 Days Post-Op  Subjective: No abdominal pain. No tachycardia overnight. Breathing "feels the same as usual".  Objective: Vital signs in last 24 hours: Temp:  [97.8 F (36.6 C)-98.6 F (37 C)] 98.3 F (36.8 C) (07/18 0424) Pulse Rate:  [57-73] 73 (07/18 0424) Resp:  [22-28] 28 (07/18 0424) BP: (114-139)/(55-75) 122/58 mmHg (07/18 0424) SpO2:  [91 %-96 %] 94 % (07/18 0424) Arterial Line BP: (127-149)/(52-63) 149/63 mmHg (07/17 2209) FiO2 (%):  [35 %] 35 % (07/17 1935)    Intake/Output from previous day: 07/17 0701 - 07/18 0700 In: -  Out: 950 [Urine:950] Intake/Output this shift:    General appearance: cooperative Resp: clear to auscultation bilaterally Cardio: regular rate and rhythm GI: soft, NT, ND  Lab Results:   Recent Labs  07/19/14 0554 07/20/14 0440  WBC 15.1* 13.6*  HGB 9.5* 9.6*  HCT 27.7* 27.5*  PLT 211 221   BMET  Recent Labs  07/19/14 0554 07/20/14 0440  NA 131* 131*  K 3.4* 3.6  CL 94* 93*  CO2 25 28  GLUCOSE 120* 139*  BUN 30* 27*  CREATININE 2.20* 1.90*  CALCIUM 7.9* 7.6*   PT/INR No results for input(s): LABPROT, INR in the last 72 hours. ABG No results for input(s): PHART, HCO3 in the last 72 hours.  Invalid input(s): PCO2, PO2  Studies/Results: No results found.  Anti-infectives: Anti-infectives    Start     Dose/Rate Route Frequency Ordered Stop   07/16/14 0800  cefUROXime (ZINACEF) 1.5 g in dextrose 5 % 50 mL IVPB     1.5 g 100 mL/hr over 30 Minutes Intravenous Every 12 hours 07/16/14 0041 07/16/14 2041      Assessment/Plan: Biliary pancreatitis-lipase normal, LFTs are stable and abdomen is soft and non tender. cardiac and respiratory status have improved. Plan lap chole with IOC today if cleared by cardiology. Procedure, risks, and benefits D/W him and his daughter. I discussed his significant cardiac and pulmonary risks. They are agreeable. POD#5 AAA EVAR Respiratory failure/pulmonary fibrosis-pulm on  board PAF/CHF-tachycardia better after med changes yesterday. I D/W Dr. Rennis Golden this AM regarding our plan for surgery today and his team will be by before to make sure he is stable for that.  LOS: 4 days    Christian Dominguez E 07/20/2014

## 2014-07-20 NOTE — Progress Notes (Addendum)
  [facility] Progress Note  Subjective  - POD #5, Denies any abdominal pain or shortness of breath.   Objective Filed Vitals:   07/20/14 0424  BP: 122/58  Pulse: 73  Temp: 98.3 F (36.8 C)  Resp: 28  62 96% FiO2 35% Venti mask   Intake/Output Summary (Last 24 hours) at 07/20/14 0741 Last data filed at 07/20/14 0428  Gross per 24 hour  Intake      0 ml  Output    950 ml  Net   -950 ml   General: resting in bed comfortably Lungs: clear anterior CV: RRR Abdomen: soft, non tender Extremities: b/l groins without hematoma, 2+ DP pulses b/l   Assessment/Planning: 81 y.o. male is s/p: EVAR for symptomatic AAA w/o rupture, gallstone pancreatitis, acute hypoxic respiratory failure, atrial tachycardia, chronic renal insufficicency, acute surgical blood loss anemia 5 Days Post-Op   S/p EVAR: doing well from vascular standpoint Gallstone pancreatitis: for lap chole this am, cardiology to see this morning prior to going to OR Atrial tach: HRs improved and stable in 70s after metoprolol increased, BP stable. Cards following.  Acute hypoxic respiratory failure: Wean Fi02 as tolerated.  Lasix 40 mg daily CRI: creatinine improving ABLA: Hgb stable after one unit pRBCs.  Appreciate general surgery, cardiology and pulmonary following.  Dispo: OR today for lap chole if cleared by cardiology  Christian Dominguez 07/20/2014 7:41 AM --  Laboratory CBC    Component Value Date/Time   WBC 13.6* 07/20/2014 0440   HGB 9.6* 07/20/2014 0440   HCT 27.5* 07/20/2014 0440   PLT 221 07/20/2014 0440    BMET    Component Value Date/Time   NA 131* 07/20/2014 0440   K 3.6 07/20/2014 0440   CL 93* 07/20/2014 0440   CO2 28 07/20/2014 0440   GLUCOSE 139* 07/20/2014 0440   BUN 27* 07/20/2014 0440   CREATININE 1.90* 07/20/2014 0440   CREATININE 2.25* 07/13/2014 1036   CALCIUM 7.6* 07/20/2014 0440   GFRNONAA 29* 07/20/2014 0440   GFRAA 34* 07/20/2014 0440    COAG Lab Results    Component Value Date   INR 1.21 07/16/2014   No results found for: PTT  Antibiotics Anti-infectives    Start     Dose/Rate Route Frequency Ordered Stop   07/16/14 0800  cefUROXime (ZINACEF) 1.5 g in dextrose 5 % 50 mL IVPB     1.5 g 100 mL/hr over 30 Minutes Intravenous Every 12 hours 07/16/14 0041 07/16/14 2041     Christian Berger, PA-C [facility] Office: [phone] Pager: [phone] 07/20/2014 7:41 AM  Doing well from the standpoint of his EVAR. Groins look fine. Possibly for cholecystectomy later today pending cardiology's blessing.  Waverly Ferrari, MD, FACS Beeper [phone]

## 2014-07-20 NOTE — Anesthesia Preprocedure Evaluation (Signed)
Anesthesia Evaluation  Patient identified by MRN, date of birth, ID band Patient awake    Reviewed: Allergy & Precautions, NPO status , Patient's Chart, lab work & pertinent test results  Airway Mallampati: I       Dental   Pulmonary    Pulmonary exam normal       Cardiovascular + Peripheral Vascular Disease Normal cardiovascular exam    Neuro/Psych    GI/Hepatic GERD-  ,  Endo/Other  diabetes, Type 2  Renal/GU      Musculoskeletal  (+) Arthritis -,   Abdominal   Peds  Hematology   Anesthesia Other Findings   Reproductive/Obstetrics                             Anesthesia Physical Anesthesia Plan  ASA: III  Anesthesia Plan: General   Post-op Pain Management:    Induction: Intravenous  Airway Management Planned: Oral ETT  Additional Equipment:   Intra-op Plan:   Post-operative Plan: Extubation in OR  Informed Consent:   Plan Discussed with: Anesthesiologist, CRNA and Surgeon  Anesthesia Plan Comments:         Anesthesia Quick Evaluation

## 2014-07-20 NOTE — Anesthesia Postprocedure Evaluation (Signed)
  Anesthesia Post-op Note  Patient: Christian Dominguez  Procedure(s) Performed: Procedure(s): LAPAROSCOPIC CHOLECYSTECTOMY WITH INTRAOPERATIVE CHOLANGIOGRAM (N/A)  Patient Location: PACU  Anesthesia Type:General  Level of Consciousness: awake  Airway and Oxygen Therapy: Patient Spontanous Breathing and Patient connected to face mask oxygen  Post-op Pain: mild  Post-op Assessment: Post-op Vital signs reviewed, Patient's Cardiovascular Status Stable, Respiratory Function Stable, Patent Airway, No signs of Nausea or vomiting and Pain level controlled              Post-op Vital Signs: Reviewed and stable  Last Vitals:  Filed Vitals:   07/20/14 1713  BP: 109/49  Pulse: 59  Temp:   Resp: 17    Complications: No apparent anesthesia complications

## 2014-07-21 ENCOUNTER — Encounter (HOSPITAL_COMMUNITY): Payer: Self-pay | Admitting: General Surgery

## 2014-07-21 LAB — GLUCOSE, CAPILLARY
GLUCOSE-CAPILLARY: 123 mg/dL — AB (ref 65–99)
GLUCOSE-CAPILLARY: 152 mg/dL — AB (ref 65–99)
Glucose-Capillary: 127 mg/dL — ABNORMAL HIGH (ref 65–99)
Glucose-Capillary: 217 mg/dL — ABNORMAL HIGH (ref 65–99)

## 2014-07-21 LAB — BASIC METABOLIC PANEL
Anion gap: 5 (ref 5–15)
BUN: 23 mg/dL — ABNORMAL HIGH (ref 6–20)
CO2: 27 mmol/L (ref 22–32)
Calcium: 7.8 mg/dL — ABNORMAL LOW (ref 8.9–10.3)
Chloride: 102 mmol/L (ref 101–111)
Creatinine, Ser: 1.63 mg/dL — ABNORMAL HIGH (ref 0.61–1.24)
GFR calc Af Amer: 41 mL/min — ABNORMAL LOW (ref >60)
GFR calc non Af Amer: 35 mL/min — ABNORMAL LOW (ref >60)
Glucose, Bld: 120 mg/dL — ABNORMAL HIGH (ref 65–99)
Potassium: 4.4 mmol/L (ref 3.5–5.1)
Sodium: 134 mmol/L — ABNORMAL LOW (ref 135–145)

## 2014-07-21 LAB — CBC
HCT: 27.3 % — ABNORMAL LOW (ref 39.0–52.0)
HEMOGLOBIN: 9.3 g/dL — AB (ref 13.0–17.0)
MCH: 30.1 pg (ref 26.0–34.0)
MCHC: 34.1 g/dL (ref 30.0–36.0)
MCV: 88.3 fL (ref 78.0–100.0)
PLATELETS: 228 10*3/uL (ref 150–400)
RBC: 3.09 MIL/uL — AB (ref 4.22–5.81)
RDW: 14.6 % (ref 11.5–15.5)
WBC: 11.1 10*3/uL — ABNORMAL HIGH (ref 4.0–10.5)

## 2014-07-21 NOTE — Progress Notes (Signed)
Name: Cori Henningsen MRN: [id number] DOB: 10-Jan-1921    ADMISSION DATE:  07/15/2014 CONSULTATION DATE:  07/17/2014  REFERRING MD :  Dr. Darrick Penna  CHIEF COMPLAINT:  Hypoxia  BRIEF PATIENT DESCRIPTION: 81yo M admitted with AAA repair and was found have gal stone pancreatitis, while in the hospital was noted to be desaturating and PCCM was consulted.  Patient has complained of SOB for years but has sought no medical attention and has not had his O2 sat checked, does not see doctors.  CT of the abdomen done in the hospital, pulmonary windows showed severe and chronic fibrosis.  SIGNIFICANT EVENTS  7/12: Went for CTA abd/pelv to assess AAA and found to have Cr of 2.25, BUN 41, recommend admit to hospital 7/13: ED for abdominal pain, N, V - CTA done. Surgery - EVAR for symptomatic AAA.  7/14: 1L of fluid overnight for low BP, awoke with increased hypoxia 7/15: PCCM 7/18 lap chole  STUDIES:  CT abd: lower lobe cystic changes with some intralobular septal thickening and groundglass. CXR: evidence of some minor pulmonary edema but fibrosis present. TTE: Preserved systolic function with indeterminate LV filling pressure. Flattening of the LV septum with moderately dilated RV and peak pulmonary artery pressure 65 mmHg plus right atrial pressure. Trivial aortic regurgitation and mild mitral regurgitation were noted.  SUBJECTIVE:  On [place] - 6L C/o nonproductive cough. Mild abd pain No chest pain or pressure.  ROS: No nausea, vomiting, or diarrhea. No headache or vision changes.  VITAL SIGNS: Temp:  [97.3 F (36.3 C)-97.8 F (36.6 C)] 97.3 F (36.3 C) (07/19 0739) Pulse Rate:  [59-86] 62 (07/19 0739) Resp:  [12-33] 26 (07/19 0739) BP: (109-137)/(46-61) 112/46 mmHg (07/19 0739) SpO2:  [87 %-98 %] 96 % (07/19 0739) Arterial Line BP: (154-168)/(61-68) 154/61 mmHg (07/18 1430) FiO2 (%):  [35 %-50 %] 40 % (07/19 0739)  PHYSICAL EXAMINATION: General:  Awake. Alert. No acute distress.  Integument:   Warm & dry. No rash on exposed skin.  HEENT: Tacky mucus membranes. No scleral injection.  Cardiovascular:  Regular rate. No edema. No appreciable JVD.  Pulmonary:coarse 'velcro' basilar crackles persist. Symmetric chest wall expansion. No accessory muscle use on Ventimask. Abdomen: Soft. Normal bowel sounds. Nondistended.  Neurological: alert, interactive,grossly non focal   Recent Labs Lab 07/19/14 0554 07/20/14 0440 07/21/14 0545  NA 131* 131* 134*  K 3.4* 3.6 4.4  CL 94* 93* 102  CO2 25 28 27   BUN 30* 27* 23*  CREATININE 2.20* 1.90* 1.63*  GLUCOSE 120* 139* 120*    Recent Labs Lab 07/19/14 0554 07/20/14 0440 07/21/14 0545  HGB 9.5* 9.6* 9.3*  HCT 27.7* 27.5* 27.3*  WBC 15.1* 13.6* 11.1*  PLT 211 221 228   Dg Cholangiogram Operative  07/20/2014   CLINICAL DATA:  Intraoperative cholangiogram during laparoscopic cholecystectomy.  EXAM: INTRAOPERATIVE CHOLANGIOGRAM  FLUOROSCOPY TIME:  15 seconds  COMPARISON:  CT abdomen and pelvis - 07/15/2014  FINDINGS: Intraoperative cholangiographic images of the right upper abdominal quadrant during laparoscopic cholecystectomy are provided for review.  Surgical clips overlie the expected location of the gallbladder fossa.  Contrast injection demonstrates selective cannulation of the central aspect of the cystic duct.  There is passage of contrast through the central aspect of the cystic duct with filling of a mildly dilated common bile duct. There is passage of contrast though the CBD and into the descending portion of the duodenum.  There is minimal reflux of injected contrast into the common hepatic duct and central aspect  of the non dilated intrahepatic biliary system.  There are least 2 ill-defined persistent nonocclusive filling defects within the distal aspect of the CBD which may represent gallstones.  There is minimal opacification of the central aspect of the pancreatic duct which appears mildly dilated.  There is minimal opacification  of the duodenal diverticulum that was demonstrated on preceding abdominal CT.  IMPRESSION: At these 2 persistent ill-defined nonocclusive filling defects within the distal aspect of the mildly dilated CBD - while potentially representative of air bubbles, nonocclusive gallstones could have a similar appearance. Correlation with the operative report is recommended. Further evaluation with ERCP could be performed as clinically indicated.   Electronically Signed   By: Simonne Come M.D.   On: 07/20/2014 13:19   Ct Chest High Resolution  07/20/2014   CLINICAL DATA:  81 year old male with shortness of breath. History of interstitial lung disease. History of stent graft repair for abdominal aortic aneurysm. Awaiting cholecystectomy.  EXAM: CT CHEST WITHOUT CONTRAST  TECHNIQUE: Multidetector CT imaging of the chest was performed following the standard protocol without intravenous contrast. High resolution imaging of the lungs, as well as inspiratory and expiratory imaging, was performed.  COMPARISON:  No priors.  FINDINGS: Mediastinum/Lymph Nodes: Heart size is mildly enlarged. There is no significant pericardial fluid, thickening or pericardial calcification. There is atherosclerosis of the thoracic aorta, the great vessels of the mediastinum and the coronary arteries, including calcified atherosclerotic plaque in the left main, left anterior descending, left circumflex and right coronary arteries. Dilatation of the pulmonic trunk (4.2 cm in diameter), and the main pulmonary arteries, suggestive of pulmonary arterial hypertension. Multiple prominent borderline and mildly enlarged mediastinal and bilateral hilar lymph nodes measuring up to 1.5 cm in short axis in the low right paratracheal station. Esophagus is unremarkable in appearance. No axillary lymphadenopathy.  Lungs/Pleura: Throughout the lungs bilaterally there are extensive areas of subpleural and peribronchovascular reticulation with associated traction  bronchiectasis and areas of frank honeycombing, with a definitive craniocaudal gradient. Additionally, predominantly in the mid to upper lungs there is concurrent ground-glass attenuation, likely to reflect some underlying pulmonary edema. Inspiratory and expiratory imaging is unremarkable. Small left pleural effusion layering dependently.  Upper Abdomen: Numerous calcified gallstones within the gallbladder. Gallbladder wall appears mildly thickened (4 mm), but there are no overt surrounding inflammatory changes in the visualized upper abdomen at this time to strongly suggest presence of an acute cholecystitis.  Musculoskeletal/Soft Tissues: There are no aggressive appearing lytic or blastic lesions noted in the visualized portions of the skeleton.  IMPRESSION: 1. The appearance of the lungs is compatible with interstitial lung disease, and the spectrum of findings is considered diagnostic of usual interstitial pneumonia (UIP). In addition, mid to upper lung predominant ground-glass attenuation is favored to reflect superimposed pulmonary edema. The possibility of acute infection/ inflammation in these regions is not excluded, but is not strongly favored. 2. Dilatation of the pulmonic trunk and main pulmonary arteries, suggestive of pulmonary arterial hypertension. 3. Atherosclerosis, including left main and 3 vessel coronary artery disease. Please note that although the presence of coronary artery calcium documents the presence of coronary artery disease, the severity of this disease and any potential stenosis cannot be assessed on this non-gated CT examination. Assessment for potential risk factor modification, dietary therapy or pharmacologic therapy may be warranted, if clinically indicated. 4. Numerous borderline enlarged and mildly enlarged mediastinal and hilar lymph nodes, presumably reactive in the setting of interstitial lung disease. 5. Cholelithiasis.   Electronically Signed  By: Trudie Reed M.D.    On: 07/20/2014 08:37    ASSESSMENT / PLAN: 1. ILD: Favor IPF based on  high-resolution CT scan - extensive fibrotic changes.  2. Acute hypoxic respiratory failure: Wean FiO2 for saturations greater than 92%.  3. Pulmonary hypertension:  Patient would not likely benefit from any pulmonary arterial vasodilator therapy as this is likely secondary to his underlying ILD.  4. Follow-up: as an outpatient with pulmonary upon discharge.  Surprisingly, pre-op functional capacity was high, hope he can return to that level of functioning. Pt & daughter prefer HHPT. Assess home O2 needs once closer to discharge  Cyril Mourning MD. [facility]. [facility] Pager [phone] If no response call [phone]   07/21/2014, 9:52 AM

## 2014-07-21 NOTE — Evaluation (Signed)
Physical Therapy Evaluation Patient Details Name: Christian Dominguez MRN: [id number] DOB: [birth year] Today's Date: 07/21/2014   History of Present Illness  Pt s/p EVAR and lap chole.  H/o pulmonary ds.  Clinical Impression  Pt admitted with/for AAA repair and found to need lap chole.  Now pulmonary problems hindering wean off oxygen.  Pt currently limited functionally due to the problems listed below.  (see problems list.)  Pt will benefit from PT to maximize function and safety to be able to get home safely with available assist of family.     Follow Up Recommendations Home health PT;Supervision for mobility/OOB    Equipment Recommendations  Rolling walker with 5" wheels    Recommendations for Other Services       Precautions / Restrictions Precautions Precautions: Fall      Mobility  Bed Mobility               General bed mobility comments: not tested, up in chair  Transfers Overall transfer level: Needs assistance   Transfers: Sit to/from Stand Sit to Stand: Min guard         General transfer comment: cues for hand placement  Ambulation/Gait Ambulation/Gait assistance: Min assist Ambulation Distance (Feet): 300 Feet (with standing rest break to recover) Assistive device: Rolling walker (2 wheeled) Gait Pattern/deviations: Step-through pattern;Drifts right/left;Trunk flexed Gait velocity: slower   General Gait Details: mildly unsteady as he fatigued, wandering and mildly staggering at times when he let go of RW to move a monitor line.  Cues for postural checks  Stairs            Wheelchair Mobility    Modified Rankin (Stroke Patients Only)       Balance Overall balance assessment: Needs assistance Sitting-balance support: Feet supported Sitting balance-Leahy Scale: Fair     Standing balance support: No upper extremity supported;Single extremity supported Standing balance-Leahy Scale: Fair                               Pertinent  Vitals/Pain Pain Assessment: Faces Faces Pain Scale: Hurts little more Pain Location: abdomen Pain Descriptors / Indicators: Operative site guarding;Sore Pain Intervention(s): Monitored during session    Home Living Family/patient expects to be discharged to:: Private residence Living Arrangements: Spouse/significant other Available Help at Discharge: Family;Available 24 hours/day Type of Home: House Home Access: Stairs to enter Entrance Stairs-Rails: Doctor, general practice of Steps: 5 Home Layout: Two level;Able to live on main level with bedroom/bathroom Home Equipment: Gil[place] Mor - single point (scooter to ride to the chicken coop)      Prior Function Level of Independence: Independent               Hand Dominance        Extremity/Trunk Assessment   Upper Extremity Assessment: Defer to OT evaluation           Lower Extremity Assessment: Overall WFL for tasks assessed;Generalized weakness (proximal weakness and truncal weaknesses)         Communication   Communication: No difficulties;HOH  Cognition Arousal/Alertness: Awake/alert Behavior During Therapy: WFL for tasks assessed/performed Overall Cognitive Status: Within Functional Limits for tasks assessed                      General Comments General comments (skin integrity, edema, etc.): SpO2 at rest on 6L at 88-90%.  With gait sats dropped to mid to upper 70's with 1-2 min return to  upper 80's    Exercises        Assessment/Plan    PT Assessment Patient needs continued PT services  PT Diagnosis Difficulty walking;Generalized weakness   PT Problem List Decreased strength;Decreased activity tolerance;Decreased balance;Decreased mobility;Decreased knowledge of use of DME;Cardiopulmonary status limiting activity  PT Treatment Interventions DME instruction;Gait training;Functional mobility training;Therapeutic activities;Balance training;Patient/family education   PT Goals (Current goals  can be found in the Care Plan section) Acute Rehab PT Goals Patient Stated Goal: back home PT Goal Formulation: With patient Time For Goal Achievement: 08/04/14 Potential to Achieve Goals: Good    Frequency Min 3X/week   Barriers to discharge        Co-evaluation               End of Session Equipment Utilized During Treatment: Oxygen Activity Tolerance: Patient tolerated treatment well;Patient limited by fatigue Patient left: in chair;with call bell/phone within reach;with family/visitor present Nurse Communication: Mobility status         Time: 4103-0131 PT Time Calculation (min) (ACUTE ONLY): 26 min   Charges:   PT Evaluation $Initial PT Evaluation Tier I: 1 Procedure PT Treatments $Gait Training: 8-22 mins   PT G Codes:        Kaiden Dardis, Eliseo Gum 07/21/2014, 3:01 PM 07/21/2014  [place] [facility], PT [phone]  (pager)

## 2014-07-21 NOTE — Progress Notes (Signed)
SUBJECTIVE:  Tolerated repeat surgery yesterday. Leukocytosis is improving. Creatinine is improved today. H/H stable.  Maintaining sinus rhythm on current dose medications.  OBJECTIVE:   Vitals:   Filed Vitals:   07/20/14 2255 07/21/14 0030 07/21/14 0401 07/21/14 0739  BP: 133/59 117/55 123/51 112/46  Pulse:   62 62  Temp:   97.8 F (36.6 C) 97.3 F (36.3 C)  TempSrc:   Axillary Axillary  Resp:   27 26  Height:      Weight:      SpO2:   94% 96%   I&O's:    Intake/Output Summary (Last 24 hours) at 07/21/14 1100 Last data filed at 07/21/14 0840  Gross per 24 hour  Intake   1075 ml  Output    775 ml  Net    300 ml   TELEMETRY: Reviewed telemetry pt in intermittent atrial tach:  PHYSICAL EXAM General: Well developed, well nourished, in no acute distress Head:   Normal cephalic and atramatic  Lungs:  Coarse Breath sounds bilaterally to auscultation, dry crackles. Heart:  irregular S1 S2  No JVD.   Abdomen: abdomen soft and non-tender Msk:  Back normal,  Normal strength and tone for age. Extremities:   No edema.   Neuro: Alert and oriented. Psych:  Normal affect, responds appropriately Skin: No rash   LABS: Basic Metabolic Panel:  Recent Labs  16/10/96 0440 07/21/14 0545  NA 131* 134*  K 3.6 4.4  CL 93* 102  CO2 28 27  GLUCOSE 139* 120*  BUN 27* 23*  CREATININE 1.90* 1.63*  CALCIUM 7.6* 7.8*   Liver Function Tests: No results for input(s): AST, ALT, ALKPHOS, BILITOT, PROT, ALBUMIN in the last 72 hours.  Recent Labs  07/19/14 0554 07/20/14 0440  LIPASE 28 40   CBC:  Recent Labs  07/20/14 0440 07/21/14 0545  WBC 13.6* 11.1*  HGB 9.6* 9.3*  HCT 27.5* 27.3*  MCV 85.4 88.3  PLT 221 228   Cardiac Enzymes: No results for input(s): CKTOTAL, CKMB, CKMBINDEX, TROPONINI in the last 72 hours. BNP: Invalid input(s): POCBNP D-Dimer: No results for input(s): DDIMER in the last 72 hours. Hemoglobin A1C: No results for input(s): HGBA1C in the last 72  hours. Fasting Lipid Panel: No results for input(s): CHOL, HDL, LDLCALC, TRIG, CHOLHDL, LDLDIRECT in the last 72 hours. Thyroid Function Tests: No results for input(s): TSH, T4TOTAL, T3FREE, THYROIDAB in the last 72 hours.  Invalid input(s): FREET3 Anemia Panel: No results for input(s): VITAMINB12, FOLATE, FERRITIN, TIBC, IRON, RETICCTPCT in the last 72 hours. Coag Panel:   Lab Results  Component Value Date   INR 1.21 07/16/2014    RADIOLOGY: Dg Cholangiogram Operative  07/20/2014   CLINICAL DATA:  Intraoperative cholangiogram during laparoscopic cholecystectomy.  EXAM: INTRAOPERATIVE CHOLANGIOGRAM  FLUOROSCOPY TIME:  15 seconds  COMPARISON:  CT abdomen and pelvis - 07/15/2014  FINDINGS: Intraoperative cholangiographic images of the right upper abdominal quadrant during laparoscopic cholecystectomy are provided for review.  Surgical clips overlie the expected location of the gallbladder fossa.  Contrast injection demonstrates selective cannulation of the central aspect of the cystic duct.  There is passage of contrast through the central aspect of the cystic duct with filling of a mildly dilated common bile duct. There is passage of contrast though the CBD and into the descending portion of the duodenum.  There is minimal reflux of injected contrast into the common hepatic duct and central aspect of the non dilated intrahepatic biliary system.  There are least 2  ill-defined persistent nonocclusive filling defects within the distal aspect of the CBD which may represent gallstones.  There is minimal opacification of the central aspect of the pancreatic duct which appears mildly dilated.  There is minimal opacification of the duodenal diverticulum that was demonstrated on preceding abdominal CT.  IMPRESSION: At these 2 persistent ill-defined nonocclusive filling defects within the distal aspect of the mildly dilated CBD - while potentially representative of air bubbles, nonocclusive gallstones could  have a similar appearance. Correlation with the operative report is recommended. Further evaluation with ERCP could be performed as clinically indicated.   Electronically Signed   By: Simonne Come M.D.   On: 07/20/2014 13:19   Ct Chest High Resolution  07/20/2014   CLINICAL DATA:  81 year old male with shortness of breath. History of interstitial lung disease. History of stent graft repair for abdominal aortic aneurysm. Awaiting cholecystectomy.  EXAM: CT CHEST WITHOUT CONTRAST  TECHNIQUE: Multidetector CT imaging of the chest was performed following the standard protocol without intravenous contrast. High resolution imaging of the lungs, as well as inspiratory and expiratory imaging, was performed.  COMPARISON:  No priors.  FINDINGS: Mediastinum/Lymph Nodes: Heart size is mildly enlarged. There is no significant pericardial fluid, thickening or pericardial calcification. There is atherosclerosis of the thoracic aorta, the great vessels of the mediastinum and the coronary arteries, including calcified atherosclerotic plaque in the left main, left anterior descending, left circumflex and right coronary arteries. Dilatation of the pulmonic trunk (4.2 cm in diameter), and the main pulmonary arteries, suggestive of pulmonary arterial hypertension. Multiple prominent borderline and mildly enlarged mediastinal and bilateral hilar lymph nodes measuring up to 1.5 cm in short axis in the low right paratracheal station. Esophagus is unremarkable in appearance. No axillary lymphadenopathy.  Lungs/Pleura: Throughout the lungs bilaterally there are extensive areas of subpleural and peribronchovascular reticulation with associated traction bronchiectasis and areas of frank honeycombing, with a definitive craniocaudal gradient. Additionally, predominantly in the mid to upper lungs there is concurrent ground-glass attenuation, likely to reflect some underlying pulmonary edema. Inspiratory and expiratory imaging is unremarkable.  Small left pleural effusion layering dependently.  Upper Abdomen: Numerous calcified gallstones within the gallbladder. Gallbladder wall appears mildly thickened (4 mm), but there are no overt surrounding inflammatory changes in the visualized upper abdomen at this time to strongly suggest presence of an acute cholecystitis.  Musculoskeletal/Soft Tissues: There are no aggressive appearing lytic or blastic lesions noted in the visualized portions of the skeleton.  IMPRESSION: 1. The appearance of the lungs is compatible with interstitial lung disease, and the spectrum of findings is considered diagnostic of usual interstitial pneumonia (UIP). In addition, mid to upper lung predominant ground-glass attenuation is favored to reflect superimposed pulmonary edema. The possibility of acute infection/ inflammation in these regions is not excluded, but is not strongly favored. 2. Dilatation of the pulmonic trunk and main pulmonary arteries, suggestive of pulmonary arterial hypertension. 3. Atherosclerosis, including left main and 3 vessel coronary artery disease. Please note that although the presence of coronary artery calcium documents the presence of coronary artery disease, the severity of this disease and any potential stenosis cannot be assessed on this non-gated CT examination. Assessment for potential risk factor modification, dietary therapy or pharmacologic therapy may be warranted, if clinically indicated. 4. Numerous borderline enlarged and mildly enlarged mediastinal and hilar lymph nodes, presumably reactive in the setting of interstitial lung disease. 5. Cholelithiasis.   Electronically Signed   By: Trudie Reed M.D.   On: 07/20/2014 08:37  Dg Chest Port 1 View  07/17/2014   CLINICAL DATA:  Shortness of Breath  EXAM: PORTABLE CHEST - 1 VIEW  COMPARISON:  July 16, 2014  FINDINGS: Central catheter tip is in the superior vena cava. No pneumothorax. There is widespread fibrotic type change in the lungs.  There may well be superimposed interstitial edema. There is no airspace consolidation. Heart is upper normal in size with evidence of a degree of pulmonary venous hypertension. No adenopathy appreciable.  IMPRESSION: The appearance is indicative of a degree of congestive heart failure superimposed on chronic interstitial fibrosis. No airspace consolidation. No pneumothorax.   Electronically Signed   By: Bretta Bang III M.D.   On: 07/17/2014 07:40   Dg Chest Port 1 View  07/16/2014   CLINICAL DATA:  Status post abdominal aortic aneurysm is arm repair ; history of diabetes.  EXAM: PORTABLE CHEST - 1 VIEW  COMPARISON:  Portable chest x-ray of July 16, 2014  FINDINGS: There remains elevation of the right hemidiaphragm. The interstitial markings of both lungs remain diffusely increased but may have slightly improved since yesterday's study. The cardiac silhouette remains enlarged. The pulmonary vascularity is less engorged. The right internal jugular Cordis sheath tip projects over the proximal portion of the SVC. There is no pleural effusion or pneumothorax. The observed bony thorax is unremarkable.  IMPRESSION: Slight interval improvement in CHF and bilateral pulmonary interstitial edema. Underlying pulmonary fibrotic changes.   Electronically Signed   By: David  [place] M.D.   On: 07/16/2014 07:58   Dg Chest Port 1 View  07/16/2014   CLINICAL DATA:  Central line placement  EXAM: PORTABLE CHEST - 1 VIEW  COMPARISON:  None.  FINDINGS: Right central venous catheter with tip projecting over the low SVC. No pneumothorax. Normal heart size. Low lung volumes. Diffuse interstitial pattern to both lungs. Correlation with prior CT abdomen and pelvis demonstrates this to represent diffuse parenchymal fibrosis with cystic honeycomb changes. No blunting of costophrenic angles. No pneumothorax.  IMPRESSION: Right central venous catheter appears to be in satisfactory position. Diffuse coarse interstitial infiltration  throughout both lungs with low lung volumes consistent with chronic fibrosis.   Electronically Signed   By: Burman Nieves M.D.   On: 07/16/2014 01:28   Dg Abd Portable 1v  07/16/2014   CLINICAL DATA:  Post abdominal aortic aneurysm surgery.  EXAM: PORTABLE ABDOMEN - 1 VIEW  COMPARISON:  CT abdomen and pelvis 07/15/2014  FINDINGS: Aorto bi-iliac stent graft is present. Bowel gas pattern is normal. Coarse infiltrates in the lung bases suggesting fibrosis.  IMPRESSION: Aorto bi-iliac stent graft. Normal bowel gas pattern. Fibrosis in the lung bases.   Electronically Signed   By: Burman Nieves M.D.   On: 07/16/2014 04:07   Ct Angio Abd/pel W/ And/or W/o  07/15/2014   CLINICAL DATA:  81 year old male with generalized abdominal pain, nausea and vomiting. History of gallstone and abdominal aortic aneurysm.  EXAM: CTA ABDOMEN AND PELVIS wITHOUT AND WITH CONTRAST  TECHNIQUE: Multidetector CT imaging of the abdomen and pelvis was performed using the standard protocol during bolus administration of intravenous contrast. Multiplanar reconstructed images and MIPs were obtained and reviewed to evaluate the vascular anatomy.  CONTRAST:  OMNIPAQUE IOHEXOL 350 MG/ML SOLN  COMPARISON:  None.  FINDINGS: There are emphysematous changes with diffuse ground-glass appearance of the visualized lung bases. There is coronary vascular calcification. Top-normal cardiac size.  No intra-abdominal free air no free fluid.  There is focal heterogeneous uterine enhancement in the  right lobe of the liver adjacent to the gallbladder. No definite lesion of identified. Subcentimeter scattered hypodense lesions are too small to characterize. There are multiple stones within the gallbladder. Trace pericholecystic fluid may be present. The common bile duct is normal in caliber. There is diffuse inflammatory changes and edema of the pancreas compatible with pancreatitis. No drainable fluid collection/abscess or pseudocyst identified. The  spleen demonstrates a lobulated contour otherwise unremarkable. The adrenal glands appear unremarkable. The right kidney appears unremarkable. There is a focal area of hypoenhancement involving the inferior pole of the left kidney concerning for a focal hypo profusion/infarct. A small accessory renal artery is noted extending from the distal aorta inferior to the aneurysm into the inferior pole of the left kidney. There is apparent hypo enhancement of the origin of this accessory artery (series 401, image 139 and coronal series 46, image 47) which may account for hypoperfusion of this portion of the left kidney. There is no hydronephrosis on either side. There is apparent diffuse thickening of the bladder wall which may be partly related to underdistention. Cystitis is not excluded. Correlation with urinalysis recommended. The prostate gland and seminal vesicles are grossly unremarkable.  Small hiatal hernia. There is apparent thickening of the stomach and duodenum, likely related to inflammatory changes of the pancreas. There is a 4.4 x 2.7 cm duodenal diverticulum. Inflammatory changes surrounding the diverticulum are likely related to pancreatitis and less likely represent diverticulitis of the duodenum. There is extensive sigmoid and colonic diverticulosis without active inflammatory changes. No evidence of bowel obstruction. Normal appendix.  There is aortoiliac atherosclerotic disease. The aorta is tortuous. There is an infrarenal abdominal aortic aneurysm measuring up to 6.9 cm in greatest axial dimension. This aneurysm starts approximately 4.2 cm inferior to the origins of the left renal artery and extends to the level of the bifurcation of the aorta. No definite extravasation of the contrast identified outside of the confines of the lumen of the aneurysm. Mild haziness of the periaortic fat is likely related to inflammatory changes extending from the pancreas. There is focal narrowing of the origin of the  SMA with approximately 40- 50% luminal narrowing. There is atherosclerotic calcification of the ostia of the renal arteries. The origins of the celiac axis, IMA as well as the origins of the renal arteries remain patent. There is no lymphadenopathy. The visualized IVC appears unremarkable. No portal venous gas identified. The splenic vein is patent.  Fat containing left inguinal hernia without evidence of inflammation or incarceration. Degenerative changes of the spine. No acute fracture.  Review of the MIP images confirms the above findings.  IMPRESSION: Pancreatitis possibly related to gallstone. No definite stone identified in the central CBD.  Infrarenal abdominal aortic aneurysm. No evidence of extravasation of contrast identified.  Cholelithiasis.  Focal hypoenhancement of the inferior aspect of the left kidney concerning for infarct. A small accessory artery appears to extend from the inferior aorta to the lower pole of the left kidney.  The duodenal diverticulum.  Extensive colonic diverticulosis.  Critical Value/emergent results were called by telephone at the time of interpretation on 07/15/2014 at 8:59 pm to Dr. Derinda Sis , who verbally acknowledged these results.   Electronically Signed   By: Elgie Collard M.D.   On: 07/15/2014 21:50    ASSESSMENT: Christian Dominguez:    Atrial tach: HR stable in 60's, very minimal breakthrough PAC's and atrial runs which are brief. Continue current meds.  Diuresed well yesterday.  Transfused.  Does not appear volume  overloaded at this time.  Renal function stable.  Continue Lasix 40 mg daily.   Pulm edema by prior chest xray, but he has significant pulmonary fibrosis. Follow-up with PCCM.  Chronic diastolic heart failure. Normal LVEF by echo.  CRI: improving.  Cardiology will sign-off. He has seen [facility] prior to admission in [place], closer to home and family wishes to have him follow-up with them. Call with questions.  Chrystie Nose, MD,  [facility] Attending Cardiologist [facility]  Chrystie Nose, MD  07/21/2014  11:00 AM

## 2014-07-21 NOTE — Progress Notes (Signed)
1 Day Post-Op  Subjective: Tolerated clears, sore, C/O central line R neck  Objective: Vital signs in last 24 hours: Temp:  [97.3 F (36.3 C)-97.8 F (36.6 C)] 97.3 F (36.3 C) (07/19 0739) Pulse Rate:  [59-86] 62 (07/19 0739) Resp:  [12-33] 26 (07/19 0739) BP: (109-137)/(46-61) 112/46 mmHg (07/19 0739) SpO2:  [87 %-98 %] 96 % (07/19 0739) Arterial Line BP: (154-168)/(61-68) 154/61 mmHg (07/18 1430) FiO2 (%):  [35 %-50 %] 40 % (07/19 0739)    Intake/Output from previous day: 07/18 0701 - 07/19 0700 In: 1000 [I.V.:1000] Out: 775 [Urine:775] Intake/Output this shift:    General appearance: cooperative Resp: mild wheeze GI: soft, incisions CDI  Lab Results:   Recent Labs  07/20/14 0440 07/21/14 0545  WBC 13.6* 11.1*  HGB 9.6* 9.3*  HCT 27.5* 27.3*  PLT 221 228   BMET  Recent Labs  07/20/14 0440 07/21/14 0545  NA 131* 134*  K 3.6 4.4  CL 93* 102  CO2 28 27  GLUCOSE 139* 120*  BUN 27* 23*  CREATININE 1.90* 1.63*  CALCIUM 7.6* 7.8*   PT/INR No results for input(s): LABPROT, INR in the last 72 hours. ABG No results for input(s): PHART, HCO3 in the last 72 hours.  Invalid input(s): PCO2, PO2  Anti-infectives: Anti-infectives    Start     Dose/Rate Route Frequency Ordered Stop   07/20/14 1000  ceFAZolin (ANCEF) IVPB 2 g/50 mL premix     2 g 100 mL/hr over 30 Minutes Intravenous  Once 07/20/14 0741 07/20/14 1217   07/16/14 0800  cefUROXime (ZINACEF) 1.5 g in dextrose 5 % 50 mL IVPB     1.5 g 100 mL/hr over 30 Minutes Intravenous Every 12 hours 07/16/14 0041 07/16/14 2041      Assessment/Plan: s/p Procedure(s): LAPAROSCOPIC CHOLECYSTECTOMY WITH INTRAOPERATIVE CHOLANGIOGRAM (N/A) POD#1 IOC showed good flow of contrast into duodenum, air bubbles vs tiny stones Advance diet Further plans per cards/pulmonary I spoke with his daughter  LOS: 5 days    Teryl Mcconaghy E 07/21/2014

## 2014-07-21 NOTE — Progress Notes (Addendum)
  Progress Note    07/21/2014 7:22 AM 1 Day Post-Op  Subjective:  C/o shortness of breath & soreness  Afebrile HR 50's-60's NSR 100's-130's systolic 93% Venturi mask at .50 & 12L  Filed Vitals:   07/21/14 0401  BP:   Pulse:   Temp: 97.8 F (36.6 C)  Resp:     Physical Exam: Cardiac:  regular Lungs:  Crackles right lung otherwise clear Incisions:  Bilateral groins are soft without hematoma Extremities:  Palpable DP pulses bilaterally   CBC    Component Value Date/Time   WBC 11.1* 07/21/2014 0545   RBC 3.09* 07/21/2014 0545   HGB 9.3* 07/21/2014 0545   HCT 27.3* 07/21/2014 0545   PLT 228 07/21/2014 0545   MCV 88.3 07/21/2014 0545   MCH 30.1 07/21/2014 0545   MCHC 34.1 07/21/2014 0545   RDW 14.6 07/21/2014 0545    BMET    Component Value Date/Time   NA 134* 07/21/2014 0545   K 4.4 07/21/2014 0545   CL 102 07/21/2014 0545   CO2 27 07/21/2014 0545   GLUCOSE 120* 07/21/2014 0545   BUN 23* 07/21/2014 0545   CREATININE 1.63* 07/21/2014 0545   CREATININE 2.25* 07/13/2014 1036   CALCIUM 7.8* 07/21/2014 0545   GFRNONAA 35* 07/21/2014 0545   GFRAA 41* 07/21/2014 0545    INR    Component Value Date/Time   INR 1.21 07/16/2014 0048     Intake/Output Summary (Last 24 hours) at 07/21/14 4920 Last data filed at 07/21/14 0405  Gross per 24 hour  Intake   1000 ml  Output    775 ml  Net    225 ml     Assessment:  81 y.o. male is s/p:  Gore Excluder bifurcated stent graft for repair of Abdominal aortic Aneurysm  5 Day Post-Op And  Laparoscopic Cholecystectomy with intraoperative cholangiogram 1 Day Post-Op   Plan: -pt doing well from EVAR with palpable DP pulses bilaterally and bilateral groins are soft -c/o soreness from surgery yesterday-pain control -pt needs to mobilize more and OOB tid at meal times -wean O2 and maximize therapy per pulmonary.  Pt turned last night and sats dropped to 70's, which is why he is on 12LO2 on the venturi mask -will  get a PT consult to eval and treat -creatinine continues to improve and is 1.63 down from 1.90 -DVT prophylaxis:  Lovenox -appreciate cards, pulmonary and general surgery with management of this pt.   Doreatha Massed, PA-C [facility] [phone] 07/21/2014 7:22 AM  Continue to wean oxygen if tolerated Home next 1-2 days after some time with PT May need home oxygen and home health PT Pt does not want inpatient Rehab or SNF  Fabienne Bruns, MD [facility] [place] Office: [phone] Pager: [phone]

## 2014-07-21 NOTE — Progress Notes (Signed)
Foley cath dc'd intact posterior portion of penis swollen, condom cath placed.  Patient tolerated without event.  Central line dc'd intact using hospital protocol, tolerated without event.  No complaints.

## 2014-07-22 LAB — GLUCOSE, CAPILLARY
GLUCOSE-CAPILLARY: 140 mg/dL — AB (ref 65–99)
GLUCOSE-CAPILLARY: 140 mg/dL — AB (ref 65–99)
GLUCOSE-CAPILLARY: 146 mg/dL — AB (ref 65–99)
Glucose-Capillary: 152 mg/dL — ABNORMAL HIGH (ref 65–99)
Glucose-Capillary: 140 mg/dL — ABNORMAL HIGH (ref 65–99)
Glucose-Capillary: 153 mg/dL — ABNORMAL HIGH (ref 65–99)

## 2014-07-22 LAB — CBC
HCT: 29.3 % — ABNORMAL LOW (ref 39.0–52.0)
Hemoglobin: 10 g/dL — ABNORMAL LOW (ref 13.0–17.0)
MCH: 29.8 pg (ref 26.0–34.0)
MCHC: 34.1 g/dL (ref 30.0–36.0)
MCV: 87.2 fL (ref 78.0–100.0)
PLATELETS: 253 10*3/uL (ref 150–400)
RBC: 3.36 MIL/uL — ABNORMAL LOW (ref 4.22–5.81)
RDW: 14.1 % (ref 11.5–15.5)
WBC: 12.9 10*3/uL — AB (ref 4.0–10.5)

## 2014-07-22 LAB — BASIC METABOLIC PANEL
ANION GAP: 9 (ref 5–15)
BUN: 23 mg/dL — AB (ref 6–20)
CO2: 28 mmol/L (ref 22–32)
CREATININE: 1.66 mg/dL — AB (ref 0.61–1.24)
Calcium: 8 mg/dL — ABNORMAL LOW (ref 8.9–10.3)
Chloride: 95 mmol/L — ABNORMAL LOW (ref 101–111)
GFR calc Af Amer: 40 mL/min — ABNORMAL LOW (ref >60)
GFR calc non Af Amer: 35 mL/min — ABNORMAL LOW (ref >60)
Glucose, Bld: 191 mg/dL — ABNORMAL HIGH (ref 65–99)
Potassium: 4 mmol/L (ref 3.5–5.1)
SODIUM: 132 mmol/L — AB (ref 135–145)

## 2014-07-22 MED ORDER — BISACODYL 10 MG RE SUPP
10.0000 mg | Freq: Once | RECTAL | Status: AC
  Administered 2014-07-22: 10 mg via RECTAL
  Filled 2014-07-22: qty 1

## 2014-07-22 MED ORDER — FLEET ENEMA 7-19 GM/118ML RE ENEM: 1.0000 | ENEMA | Freq: Once | RECTAL | Status: DC

## 2014-07-22 MED ORDER — BISACODYL 10 MG RE SUPP: 10.0000 mg | Freq: Every day | RECTAL | Status: DC | PRN

## 2014-07-22 NOTE — Progress Notes (Signed)
Physical Therapy Treatment Patient Details Name: Christian Dominguez MRN: [id number] DOB: September 14, 1921 Today's Date: 07/22/2014    History of Present Illness Pt s/p EVAR and lap chole.  H/o pulmonary ds.    PT Comments    Progressing slowly.  Poor use of the RW.  Sats still dropping into the 70's on 5L [place].  Will benefit from HHPT.   Follow Up Recommendations  Home health PT;Supervision for mobility/OOB     Equipment Recommendations  Rolling walker with 5" wheels    Recommendations for Other Services       Precautions / Restrictions Precautions Precautions: Fall Restrictions Weight Bearing Restrictions: No    Mobility  Bed Mobility               General bed mobility comments: not tested, up in chair  Transfers Overall transfer level: Needs assistance Equipment used: Rolling walker (2 wheeled) Transfers: Sit to/from [employer] Sit to Stand: Min assist Stand pivot transfers: Min assist       General transfer comment: cues for hand placement  Ambulation/Gait Ambulation/Gait assistance: Min assist Ambulation Distance (Feet): 180 Feet Assistive device: Rolling walker (2 wheeled) Gait Pattern/deviations: Step-through pattern Gait velocity: slower   General Gait Details: more unsteady today with RW; sats continue to drop into the mid/upper 70's   Stairs Stairs: Yes Stairs assistance: Min assist Stair Management: One rail Left;Step to pattern;Forwards Number of Stairs: 2 General stair comments: safe with rail  Wheelchair Mobility    Modified Rankin (Stroke Patients Only)       Balance   Sitting-balance support: Feet supported Sitting balance-Leahy Scale: Fair       Standing balance-Leahy Scale: Poor (more unsteady today)                      Cognition Arousal/Alertness: Awake/alert Behavior During Therapy: WFL for tasks assessed/performed Overall Cognitive Status: Within Functional Limits for tasks assessed                      Exercises      General Comments General comments (skin integrity, edema, etc.): sats on 5L [place] with amb dropped to 76% with return to 89% in 2-3 min.      Pertinent Vitals/Pain Pain Assessment: No/denies pain Faces Pain Scale: No hurt Pain Intervention(s): Monitored during session    Home Living                      Prior Function            PT Goals (current goals can now be found in the care plan section) Acute Rehab PT Goals Patient Stated Goal: back home PT Goal Formulation: With patient Time For Goal Achievement: 08/04/14 Potential to Achieve Goals: Good Progress towards PT goals: Progressing toward goals    Frequency  Min 3X/week    PT Plan Current plan remains appropriate    Co-evaluation             End of Session Equipment Utilized During Treatment: Oxygen Activity Tolerance: Patient tolerated treatment well;Patient limited by fatigue Patient left: in chair;with call bell/phone within reach;with family/visitor present     Time: 2878-6767 PT Time Calculation (min) (ACUTE ONLY): 23 min  Charges:  $Gait Training: 8-22 mins $Therapeutic Activity: 8-22 mins                    G Codes:      Stephinie Battisti, Eliseo Gum 07/22/2014,  6:18 PM 07/22/2014  [place], PT [phone]  (pager)

## 2014-07-22 NOTE — Progress Notes (Signed)
Report called to RN, 2W, all questions answered.  Family aware of transfer.  Patient on BSC, had small hard stool continue to watch.

## 2014-07-22 NOTE — Progress Notes (Signed)
Report received from [facility].

## 2014-07-22 NOTE — Progress Notes (Addendum)
  [facility] Progress Note  Subjective  - POD #2 Lap chole, POD #6 EVAR  No complaints today.   Objective Filed Vitals:   07/22/14 0339  BP: 121/65  Pulse: 64  Temp: 97.9 F (36.6 C)  Resp: 27  02 95% 4L  Intake/Output Summary (Last 24 hours) at 07/22/14 0839 Last data filed at 07/22/14 0300  Gross per 24 hour  Intake   1035 ml  Output    800 ml  Net    235 ml   General: resting in bed in NAD Lungs: coarse dry crackles bilaterally CV: regular s1 s2 Abdomen: normoactive bowel sounds, soft nontender, no distension, port site incisions intact.  Extremities: palpable 2+ dorsalis pedis, bilateral groins soft without hematoma.   Assessment/Planning: 81 y.o. male is s/p: Lap chole POD #2, EVAR POD #6  Gallstone pancreatitis s/p lap chole: tolerating regular diet well S/p EVAR: stable Atrial tach: HR stable, BP stable Acute hypoxic respiratory failure: off venti mask, now on 4L [place].  CRI: Creatinine continuing to improve.  Dispo: transfer to 2W. Continue to wean 02. Will likely require home 02. Plan d/c tomorrow.   Raymond Gurney 07/22/2014 8:39 AM --  Laboratory CBC    Component Value Date/Time   WBC 12.9* 07/22/2014 0232   HGB 10.0* 07/22/2014 0232   HCT 29.3* 07/22/2014 0232   PLT 253 07/22/2014 0232    BMET    Component Value Date/Time   NA 132* 07/22/2014 0232   K 4.0 07/22/2014 0232   CL 95* 07/22/2014 0232   CO2 28 07/22/2014 0232   GLUCOSE 191* 07/22/2014 0232   BUN 23* 07/22/2014 0232   CREATININE 1.66* 07/22/2014 0232   CREATININE 2.25* 07/13/2014 1036   CALCIUM 8.0* 07/22/2014 0232   GFRNONAA 35* 07/22/2014 0232   GFRAA 40* 07/22/2014 0232    COAG Lab Results  Component Value Date   INR 1.21 07/16/2014   No results found for: PTT  Antibiotics Anti-infectives    Start     Dose/Rate Route Frequency Ordered Stop   07/20/14 1000  ceFAZolin (ANCEF) IVPB 2 g/50 mL premix     2 g 100 mL/hr over 30 Minutes Intravenous   Once 07/20/14 0741 07/20/14 1217   07/16/14 0800  cefUROXime (ZINACEF) 1.5 g in dextrose 5 % 50 mL IVPB     1.5 g 100 mL/hr over 30 Minutes Intravenous Every 12 hours 07/16/14 0041 07/16/14 2041       Maris Berger, PA-C [facility] Office: [phone] Pager: [phone] 07/22/2014 8:39 AM   Overall slow improvement Agree with above Most likely d/c home tomorrow  Fabienne Bruns, MD [facility] [place] Office: [phone] Pager: [phone]

## 2014-07-22 NOTE — Care Management Important Message (Signed)
Important Message  Patient Details  Name: Christian Dominguez MRN: [id number] Date of Birth: [birth year]   Medicare Important Message Given:  Yes-third notification given    Kyla Balzarine 07/22/2014, 11:17 AMImportant Message  Patient Details  Name: Christian Dominguez MRN: [id number] Date of Birth: 1921/11/03   Medicare Important Message Given:  Yes-third notification given    Kyla Balzarine 07/22/2014, 11:16 AM

## 2014-07-22 NOTE — Progress Notes (Signed)
Name: Christian Dominguez MRN: [id number] DOB: 05-09-1921    ADMISSION DATE:  07/15/2014 CONSULTATION DATE:  07/17/2014  REFERRING MD :  Dr. Darrick Penna  CHIEF COMPLAINT:  Hypoxia  BRIEF PATIENT DESCRIPTION: 81yo M admitted with AAA repair and was found have gal stone pancreatitis, while in the hospital was noted to be desaturating and PCCM was consulted.  Patient has complained of SOB for years but has sought no medical attention and has not had his O2 sat checked, does not see doctors.  CT of the abdomen done in the hospital, pulmonary windows showed severe and chronic fibrosis.  SIGNIFICANT EVENTS  7/12: Went for CTA abd/pelv to assess AAA and found to have Cr of 2.25, BUN 41, recommend admit to hospital 7/13: ED for abdominal pain, N, V - CTA done. Surgery - EVAR for symptomatic AAA.  7/14: 1L of fluid overnight for low BP, awoke with increased hypoxia 7/15: PCCM 7/18 lap chole  STUDIES:  CT abd: lower lobe cystic changes with some intralobular septal thickening and groundglass. CXR: evidence of some minor pulmonary edema but fibrosis present. TTE: Preserved systolic function with indeterminate LV filling pressure. Flattening of the LV septum with moderately dilated RV and peak pulmonary artery pressure 65 mmHg plus right atrial pressure. Trivial aortic regurgitation and mild mitral regurgitation were noted.  SUBJECTIVE:  On [place] - 4L Starting PO Mild abd pain No chest pain or pressure.  ROS: No nausea, vomiting, or diarrhea. No headache or vision changes.  VITAL SIGNS: Temp:  [97.8 F (36.6 C)-98.4 F (36.9 C)] 97.9 F (36.6 C) (07/20 0339) Pulse Rate:  [62-72] 64 (07/20 0339) Resp:  [14-27] 27 (07/20 0339) BP: (101-130)/(41-65) 121/65 mmHg (07/20 0339) SpO2:  [91 %-97 %] 95 % (07/20 0339)  PHYSICAL EXAMINATION: General:  Awake. Alert. No acute distress.  Integument:  Warm & dry. No rash on exposed skin.  HEENT: Tacky mucus membranes. No scleral injection.  Cardiovascular:   Regular rate. No edema. No appreciable JVD.  Pulmonary:coarse 'velcro' basilar crackles persist. Symmetric chest wall expansion. No accessory muscle use on Ventimask. Abdomen: Soft. Normal bowel sounds. Nondistended.  Neurological: alert, interactive,grossly non focal   Recent Labs Lab 07/20/14 0440 07/21/14 0545 07/22/14 0232  NA 131* 134* 132*  K 3.6 4.4 4.0  CL 93* 102 95*  CO2 BUN 27* 23* 23*  CREATININE 1.90* 1.63* 1.66*  GLUCOSE 139* 120* 191*    Recent Labs Lab 07/20/14 0440 07/21/14 0545 07/22/14 0232  HGB 9.6* 9.3* 10.0*  HCT 27.5* 27.3* 29.3*  WBC 13.6* 11.1* 12.9*  PLT 221 228 253   Dg Cholangiogram Operative  07/20/2014   CLINICAL DATA:  Intraoperative cholangiogram during laparoscopic cholecystectomy.  EXAM: INTRAOPERATIVE CHOLANGIOGRAM  FLUOROSCOPY TIME:  15 seconds  COMPARISON:  CT abdomen and pelvis - 07/15/2014  FINDINGS: Intraoperative cholangiographic images of the right upper abdominal quadrant during laparoscopic cholecystectomy are provided for review.  Surgical clips overlie the expected location of the gallbladder fossa.  Contrast injection demonstrates selective cannulation of the central aspect of the cystic duct.  There is passage of contrast through the central aspect of the cystic duct with filling of a mildly dilated common bile duct. There is passage of contrast though the CBD and into the descending portion of the duodenum.  There is minimal reflux of injected contrast into the common hepatic duct and central aspect of the non dilated intrahepatic biliary system.  There are least 2 ill-defined persistent nonocclusive filling defects within the  distal aspect of the CBD which may represent gallstones.  There is minimal opacification of the central aspect of the pancreatic duct which appears mildly dilated.  There is minimal opacification of the duodenal diverticulum that was demonstrated on preceding abdominal CT.  IMPRESSION: At these 2  persistent ill-defined nonocclusive filling defects within the distal aspect of the mildly dilated CBD - while potentially representative of air bubbles, nonocclusive gallstones could have a similar appearance. Correlation with the operative report is recommended. Further evaluation with ERCP could be performed as clinically indicated.   Electronically Signed   By: Simonne Come M.D.   On: 07/20/2014 13:19    ASSESSMENT / PLAN: 1. ILD: Favor IPF based on  high-resolution CT scan - extensive fibrotic changes.  2. Acute hypoxic respiratory failure: Wean FiO2 for saturations greater than 90%. Check & document RA satn for home O2  3. Pulmonary hypertension:  Patient would not likely benefit from any pulmonary arterial vasodilator therapy as this is likely secondary to his underlying ILD.  4. Follow-up: arranged as an outpatient with pulmonary upon discharge.  Surprisingly, pre-op functional capacity was high, hope he can return to that level of functioning. Pt & daughter prefer HHPT. PCCM to sign off   Cyril Mourning MD. Tonny Bollman. [facility] Pager [phone] If no response call [phone]   07/22/2014, 9:52 AM

## 2014-07-22 NOTE — Discharge Instructions (Signed)
[facility] ______[facility] °LAPAROSCOPIC SURGERY: POST OP INSTRUCTIONS °Always review your discharge instruction sheet given to you by the facility where your surgery was performed. °IF YOU HAVE DISABILITY OR FAMILY LEAVE FORMS, YOU MUST BRING THEM TO THE OFFICE FOR PROCESSING.   °DO NOT GIVE THEM TO YOUR DOCTOR. ° °1. A prescription for pain medication may be given to you upon discharge.  Take your pain medication as prescribed, if needed.  If narcotic pain medicine is not needed, then you may take acetaminophen (Tylenol) or ibuprofen (Advil) as needed. °2. Take your usually prescribed medications unless otherwise directed. °3. If you need a refill on your pain medication, please contact your pharmacy.  They will contact our office to request authorization. Prescriptions will not be filled after 5pm or on week-ends. °4. You should follow a light diet the first few days after arrival home, such as soup and crackers, etc.  Be sure to include lots of fluids daily. °5. Most patients will experience some swelling and bruising in the area of the incisions.  Ice packs will help.  Swelling and bruising can take several days to resolve.  °6. It is common to experience some constipation if taking pain medication after surgery.  Increasing fluid intake and taking a stool softener (such as Colace) will usually help or prevent this problem from occurring.  A mild laxative (Milk of Magnesia or Miralax) should be taken according to package instructions if there are no bowel movements after 48 hours. °7. Unless discharge instructions indicate otherwise, you may remove your bandages 24-48 hours after surgery, and you may shower at that time.  You may have steri-strips (small skin tapes) in place directly over the incision.  These strips should be left on the skin for 7-10 days.  If your surgeon used skin glue on the incision, you may shower in 24 hours.  The glue will flake off over the next 2-3 weeks.  Any sutures or  staples will be removed at the office during your follow-up visit. °8. ACTIVITIES:  You may resume regular (light) daily activities beginning the next day--such as daily self-care, walking, climbing stairs--gradually increasing activities as tolerated.  You may have sexual intercourse when it is comfortable.  Refrain from any heavy lifting or straining until approved by your doctor. °a. You may drive when you are no longer taking prescription pain medication, you can comfortably wear a seatbelt, and you can safely maneuver your car and apply brakes. °b. RETURN TO WORK:  __________________________________________________________ °9. You should see your doctor in the office for a follow-up appointment approximately 2-3 weeks after your surgery.  Make sure that you call for this appointment within a day or two after you arrive home to insure a convenient appointment time. °10. OTHER INSTRUCTIONS: __________________________________________________________________________________________________________________________ __________________________________________________________________________________________________________________________ °WHEN TO CALL YOUR DOCTOR: °1. Fever over 101.0 °2. Inability to urinate °3. Continued bleeding from incision. °4. Increased pain, redness, or drainage from the incision. °5. Increasing abdominal pain ° °The clinic staff is available to answer your questions during regular business hours.  Please don’t hesitate to call and ask to speak to one of the nurses for clinical concerns.  If you have a medical emergency, go to the nearest emergency room or call 911.  A surgeon from [facility] is always on call at the hospital. °[address], [address], [place], [place]  [address] ? [address] [address], [place], [place]   [address] °[phone] ? [phone] ? FAX [phone] °Web site:   [email or web address] °

## 2014-07-22 NOTE — Progress Notes (Signed)
2 Days Post-Op  Subjective: Low appetite but eating a little at each meal per daughter  Objective: Vital signs in last 24 hours: Temp:  [97.8 F (36.6 C)-98.4 F (36.9 C)] 97.9 F (36.6 C) (07/20 0339) Pulse Rate:  [62-72] 64 (07/20 0339) Resp:  [14-27] 27 (07/20 0339) BP: (101-130)/(41-65) 121/65 mmHg (07/20 0339) SpO2:  [91 %-97 %] 95 % (07/20 0339)    Intake/Output from previous day: 07/19 0701 - 07/20 0700 In: 1035 [P.O.:1035] Out: 800 [Urine:800] Intake/Output this shift:    General appearance: cooperative Resp: clear to auscultation bilaterally GI: soft, incisions CDI, active BS  Lab Results:   Recent Labs  07/21/14 0545 07/22/14 0232  WBC 11.1* 12.9*  HGB 9.3* 10.0*  HCT 27.3* 29.3*  PLT 228 253   BMET  Recent Labs  07/21/14 0545 07/22/14 0232  NA 134* 132*  K 4.4 4.0  CL 102 95*  CO2 27 28  GLUCOSE 120* 191*  BUN 23* 23*  CREATININE 1.63* 1.66*  CALCIUM 7.8* 8.0*   PT/INR No results for input(s): LABPROT, INR in the last 72 hours. ABG No results for input(s): PHART, HCO3 in the last 72 hours.  Invalid input(s): PCO2, PO2  Studies/Results: Dg Cholangiogram Operative  07/20/2014   CLINICAL DATA:  Intraoperative cholangiogram during laparoscopic cholecystectomy.  EXAM: INTRAOPERATIVE CHOLANGIOGRAM  FLUOROSCOPY TIME:  15 seconds  COMPARISON:  CT abdomen and pelvis - 07/15/2014  FINDINGS: Intraoperative cholangiographic images of the right upper abdominal quadrant during laparoscopic cholecystectomy are provided for review.  Surgical clips overlie the expected location of the gallbladder fossa.  Contrast injection demonstrates selective cannulation of the central aspect of the cystic duct.  There is passage of contrast through the central aspect of the cystic duct with filling of a mildly dilated common bile duct. There is passage of contrast though the CBD and into the descending portion of the duodenum.  There is minimal reflux of injected contrast  into the common hepatic duct and central aspect of the non dilated intrahepatic biliary system.  There are least 2 ill-defined persistent nonocclusive filling defects within the distal aspect of the CBD which may represent gallstones.  There is minimal opacification of the central aspect of the pancreatic duct which appears mildly dilated.  There is minimal opacification of the duodenal diverticulum that was demonstrated on preceding abdominal CT.  IMPRESSION: At these 2 persistent ill-defined nonocclusive filling defects within the distal aspect of the mildly dilated CBD - while potentially representative of air bubbles, nonocclusive gallstones could have a similar appearance. Correlation with the operative report is recommended. Further evaluation with ERCP could be performed as clinically indicated.   Electronically Signed   By: Simonne Come M.D.   On: 07/20/2014 13:19    Anti-infectives: Anti-infectives    Start     Dose/Rate Route Frequency Ordered Stop   07/20/14 1000  ceFAZolin (ANCEF) IVPB 2 g/50 mL premix     2 g 100 mL/hr over 30 Minutes Intravenous  Once 07/20/14 0741 07/20/14 1217   07/16/14 0800  cefUROXime (ZINACEF) 1.5 g in dextrose 5 % 50 mL IVPB     1.5 g 100 mL/hr over 30 Minutes Intravenous Every 12 hours 07/16/14 0041 07/16/14 2041      Assessment/Plan: s/p Procedure(s): LAPAROSCOPIC CHOLECYSTECTOMY WITH INTRAOPERATIVE CHOLANGIOGRAM (N/A) Doing well OK for D/C from our standpoint We will arrange F/U at our office  LOS: 6 days    Cerise Lieber E 07/22/2014

## 2014-07-23 ENCOUNTER — Other Ambulatory Visit: Payer: Self-pay | Admitting: *Deleted

## 2014-07-23 DIAGNOSIS — I714 Abdominal aortic aneurysm, without rupture, unspecified: Secondary | ICD-10-CM

## 2014-07-23 DIAGNOSIS — Z48812 Encounter for surgical aftercare following surgery on the circulatory system: Secondary | ICD-10-CM

## 2014-07-23 LAB — CREATININE, SERUM
CREATININE: 1.67 mg/dL — AB (ref 0.61–1.24)
GFR calc Af Amer: 40 mL/min — ABNORMAL LOW (ref >60)
GFR calc non Af Amer: 34 mL/min — ABNORMAL LOW (ref >60)

## 2014-07-23 LAB — GLUCOSE, CAPILLARY
Glucose-Capillary: 134 mg/dL — ABNORMAL HIGH (ref 65–99)
Glucose-Capillary: 160 mg/dL — ABNORMAL HIGH (ref 65–99)

## 2014-07-23 MED ORDER — HYDROCODONE-ACETAMINOPHEN 5-325 MG PO TABS: 1.0000 | ORAL_TABLET | ORAL | Status: AC | PRN

## 2014-07-23 NOTE — Progress Notes (Signed)
Pt was reduced to 2L of O2 and after sitting up and eating breakfast was 96% on 2L.  He was reduced to R/A and was 87% at rest.  Placed back on 2L the pt went back to 97% on 2L.

## 2014-07-23 NOTE — Progress Notes (Signed)
Utilization review completed.  

## 2014-07-23 NOTE — Care Management Note (Addendum)
Case Management Note Donn Pierini RN, BSN Unit 2W-Case Manager [phone]  Patient Details  Name: Christian Dominguez MRN: [id number] Date of Birth: 07/16/1921   Subjective/Objective:          Pt form home with wife admitted with  AAA, and pancreatitis. POD #3 Lap chole, POD # 6 EVAR         Action/Plan: [facility] following ,plan: ? lap. cholecy. with IOC on 07/17/14. Pt to return home when medically stable. CM to f/u with disposition needs.  Expected Discharge Date:    07/23/14              Expected Discharge Plan:  Home w Home Health Services  In-House Referral:     Discharge planning Services  CM Consult  Post Acute Care Choice:  Durable Medical Equipment, Home Health Choice offered to:  Adult Children  DME Arranged:  Oxygen DME Agency:  [facility]  HH Arranged:  PT, OT, Nurse's Aide, Respirator Therapy HH Agency:  [facility]  Status of Service:  Completed, signed off  Medicare Important Message Given:  Yes-third notification given Date Medicare IM Given:    Medicare IM give by:    Date Additional Medicare IM Given:    Additional Medicare Important Message give by:     If discussed at Long Length of Stay Meetings, dates discussed:  07/23/14  Additional Comments: Colletta [place] (Daughter) [phone]  07/23/14- pt for d/c home today- orders for HH-PT/OT/RT and also will need home 02- spoke with pt and daughter at bedside- offered choice for [facility] agencies- per daughter choice- will use [facility] for [facility] services- daughter also request a HH-aide and would like info on private duty- list provided on the private duty agencies- explained that private duty services were an out of pocket expense- referral for [facility] services called to Clydie Braun with [facility] and spoke with Lucky Cowboy with [facility] for DME needs for home 02- portable tank to be delivered to room prior to discharge   Donn Pierini [place], [place] [phone] 07/23/2014, 11:11 AM

## 2014-07-23 NOTE — Progress Notes (Signed)
Pt D/Cd home.  Alert and oriented x4.  No c/o pain.  IV DCd Tele D/Cd.  Pt and daughter educated on diet, activity, meds, and follow-up care and instructions.  Pt and daughter verbalized understanding.  Pt will go home with [facility] and HH oxygen with daughter.  Taken to car via W/C

## 2014-07-23 NOTE — Progress Notes (Addendum)
  [facility] Progress Note  Subjective  - POD #3 Lap chole, POD # 6 EVAR  Wants to go home  Objective Filed Vitals:   07/23/14 0551  BP: 113/44  Pulse: 71  Temp: 98.4 F (36.9 C)  Resp: 18  02 93% 4L  Intake/Output Summary (Last 24 hours) at 07/23/14 0731 Last data filed at 07/23/14 7893  Gross per 24 hour  Intake    240 ml  Output   1025 ml  Net   -785 ml   Bibasilar dry crackles, no increased respiratory effort Regular rate Abdomen normoactive bowel sounds, soft non tender, port incisions intact Palpable 2+ DP pulses b/l  Assessment/Planning: 81 y.o. male is s/p: Lap chole POD 3, EVAR POD 6 Continuing to progress Still requiring 4L 02 same as yesterday. Attempt to wean down to 2L today.  Creatinine stable. HR stable.  Ambulate.  Possible d/c home today if able to wean down 02.   Christian Dominguez 07/23/2014 7:31 AM --  Laboratory CBC    Component Value Date/Time   WBC 12.9* 07/22/2014 0232   HGB 10.0* 07/22/2014 0232   HCT 29.3* 07/22/2014 0232   PLT 253 07/22/2014 0232    BMET    Component Value Date/Time   NA 132* 07/22/2014 0232   K 4.0 07/22/2014 0232   CL 95* 07/22/2014 0232   CO2 28 07/22/2014 0232   GLUCOSE 191* 07/22/2014 0232   BUN 23* 07/22/2014 0232   CREATININE 1.67* 07/23/2014 0430   CREATININE 2.25* 07/13/2014 1036   CALCIUM 8.0* 07/22/2014 0232   GFRNONAA 34* 07/23/2014 0430   GFRAA 40* 07/23/2014 0430    COAG Lab Results  Component Value Date   INR 1.21 07/16/2014   No results found for: PTT  Antibiotics Anti-infectives    Start     Dose/Rate Route Frequency Ordered Stop   07/20/14 1000  ceFAZolin (ANCEF) IVPB 2 g/50 mL premix     2 g 100 mL/hr over 30 Minutes Intravenous  Once 07/20/14 0741 07/20/14 1217   07/16/14 0800  cefUROXime (ZINACEF) 1.5 g in dextrose 5 % 50 mL IVPB     1.5 g 100 mL/hr over 30 Minutes Intravenous Every 12 hours 07/16/14 0041 07/16/14 2041       Christian Berger,  PA-C [facility] Office: [phone] Pager: [phone] 07/23/2014 7:31 AM     Agree with above d/c home Follow up CT without contrast 3 weeks  Fabienne Bruns, MD [facility] [place] Office: [phone] Pager: [phone]

## 2014-07-25 DIAGNOSIS — E785 Hyperlipidemia, unspecified: Secondary | ICD-10-CM | POA: Diagnosis not present

## 2014-07-25 DIAGNOSIS — Z48812 Encounter for surgical aftercare following surgery on the circulatory system: Secondary | ICD-10-CM | POA: Diagnosis not present

## 2014-07-25 DIAGNOSIS — K219 Gastro-esophageal reflux disease without esophagitis: Secondary | ICD-10-CM | POA: Diagnosis not present

## 2014-07-25 DIAGNOSIS — M150 Primary generalized (osteo)arthritis: Secondary | ICD-10-CM | POA: Diagnosis not present

## 2014-07-25 DIAGNOSIS — J449 Chronic obstructive pulmonary disease, unspecified: Secondary | ICD-10-CM | POA: Diagnosis not present

## 2014-07-25 DIAGNOSIS — E119 Type 2 diabetes mellitus without complications: Secondary | ICD-10-CM | POA: Diagnosis not present

## 2014-07-25 DIAGNOSIS — Z48815 Encounter for surgical aftercare following surgery on the digestive system: Secondary | ICD-10-CM | POA: Diagnosis not present

## 2014-07-27 DIAGNOSIS — J449 Chronic obstructive pulmonary disease, unspecified: Secondary | ICD-10-CM | POA: Diagnosis not present

## 2014-07-27 DIAGNOSIS — K219 Gastro-esophageal reflux disease without esophagitis: Secondary | ICD-10-CM | POA: Diagnosis not present

## 2014-07-27 DIAGNOSIS — M150 Primary generalized (osteo)arthritis: Secondary | ICD-10-CM | POA: Diagnosis not present

## 2014-07-27 DIAGNOSIS — Z48815 Encounter for surgical aftercare following surgery on the digestive system: Secondary | ICD-10-CM | POA: Diagnosis not present

## 2014-07-27 DIAGNOSIS — E119 Type 2 diabetes mellitus without complications: Secondary | ICD-10-CM | POA: Diagnosis not present

## 2014-07-27 DIAGNOSIS — Z48812 Encounter for surgical aftercare following surgery on the circulatory system: Secondary | ICD-10-CM | POA: Diagnosis not present

## 2014-07-28 DIAGNOSIS — M150 Primary generalized (osteo)arthritis: Secondary | ICD-10-CM | POA: Diagnosis not present

## 2014-07-28 DIAGNOSIS — E119 Type 2 diabetes mellitus without complications: Secondary | ICD-10-CM | POA: Diagnosis not present

## 2014-07-28 DIAGNOSIS — Z48815 Encounter for surgical aftercare following surgery on the digestive system: Secondary | ICD-10-CM | POA: Diagnosis not present

## 2014-07-28 DIAGNOSIS — Z48812 Encounter for surgical aftercare following surgery on the circulatory system: Secondary | ICD-10-CM | POA: Diagnosis not present

## 2014-07-28 DIAGNOSIS — K219 Gastro-esophageal reflux disease without esophagitis: Secondary | ICD-10-CM | POA: Diagnosis not present

## 2014-07-28 DIAGNOSIS — J449 Chronic obstructive pulmonary disease, unspecified: Secondary | ICD-10-CM | POA: Diagnosis not present

## 2014-07-28 NOTE — Discharge Summary (Signed)
[facility] Discharge Summary  Christian Dominguez [birth year] 81 y.o. male  [id number]  Admission Date: 07/15/2014  Discharge Date: 07/23/2014  Physician: Fabienne Bruns, MD  Admission Diagnosis: Abdominal aortic aneurysm (AAA) >39 mm diameter [I71.4]  HPI:   This is a 81 y.o. male  with known history of 6.2 cm abdominal aortic aneurysm who presents to the emergency room with complaints of abdominal pain, nausea and vomiting since 1 pm. He was also found to be hypotensive. He is accompanied by his daughter and granddaughter. He had an episode of abdominal pain a few weeks ago that was found to be pancreatitis. He was seen recently by Dr. Edilia Bo on 07/01/14 for his AAA that was found incidentally on an abdominal ultrasound. Dr. Edilia Bo referred the patient for cardiac clearance for future elective repair. He was also recommended to have a CT angiogram for evaluation but did not complete this yet due to renal dysfunction. He did undergo cardiac evaluation recently and the family reports everything "was good." There are no accessible records of this. He does also have gallstones.  He has a past medical history of hyperlipidemia, cholecystitis and diabetes.   Hospital Course:  The patient was admitted to the hospital and taken to the operating room on 07/15/2014 and underwent endovascular repair of his abdominal aortic aneurysm.  He tolerated the procedure well and transported to the PACU in stable condition.  Overnight, he was hypotensive and received 1L worth of fluid boluses.  POD 1: Cardiology was consulted for paroxysmal atrial fibrillaiton. He had crackles on exam and required use of a venturi mask. He had leukocytosis with an elevated lipase. General surgery was consulted for gallstone pancreatitis. Plans were made for lap cholecystectomy and IOC the following day. He had an elevated creatinine at baseline that was stable on POD 1.   POD 2: The patient continued to  have increasing oxygen requirements and increased respiratory rate. His chest x-ray revealed pulmonary fibrosis with pulmonary edema. He was gently diureses. Pulmonology was consulted for assistance. He was transfused one unit of pRBCs for acute blood loss anemia. His atrial tachycardia was well controlled on low dose diltiazem.   The patient continued to make slow progress but his respiratory status improved and he underwent lap chole on POD 5.  He had no complications and was transported to the recovery room in stable condition.   POD 1 lap chole, he was still having increased 02 requirements. He was able to wean down his 02 to 4L on POD 2 and was transferred to the floor.  His oxygen was able to weaned to 2L and he discharged home on POD #3 lap chole, POD #6 [facility] with home 02, physical therapy and occupational therapy. His creatinine was stable and cardiac status stable.    CBC    Component Value Date/Time   WBC 12.9* 07/22/2014 0232   RBC 3.36* 07/22/2014 0232   HGB 10.0* 07/22/2014 0232   HCT 29.3* 07/22/2014 0232   PLT 253 07/22/2014 0232   MCV 87.2 07/22/2014 0232   MCH 29.8 07/22/2014 0232   MCHC 34.1 07/22/2014 0232   RDW 14.1 07/22/2014 0232    BMET    Component Value Date/Time   NA 132* 07/22/2014 0232   K 4.0 07/22/2014 0232   CL 95* 07/22/2014 0232   CO2 28 07/22/2014 0232   GLUCOSE 191* 07/22/2014 0232   BUN 23* 07/22/2014 0232   CREATININE 1.67* 07/23/2014 0430   CREATININE 2.25* 07/13/2014 1036  CALCIUM 8.0* 07/22/2014 0232   GFRNONAA 34* 07/23/2014 0430   GFRAA 40* 07/23/2014 0430     Discharge Instructions:   The patient is discharged to home with extensive instructions on wound care and progressive ambulation.  They are instructed not to drive or perform any heavy lifting until returning to see the physician in his office.  Discharge Instructions    ABDOMINAL PROCEDURE/ANEURYSM REPAIR/AORTO-BIFEMORAL BYPASS:  Call MD for increased abdominal pain;  cramping diarrhea; nausea/vomiting    Complete by:  As directed      CAROTID Sugery: Call MD for difficulty swallowing or speaking; weakness in arms or legs that is a new symtom; severe headache.  If you have increased swelling in the neck and/or  are having difficulty breathing, CALL [phone]    Complete by:  As directed      Call MD for:  redness, tenderness, or signs of infection (pain, swelling, bleeding, redness, odor or green/yellow discharge around incision site)    Complete by:  As directed      Call MD for:  severe or increased pain, loss or decreased feeling  in affected limb(s)    Complete by:  As directed      Call MD for:  temperature >100.5    Complete by:  As directed      Discharge wound care:    Complete by:  As directed   Wash abdominal wounds daily with soap and water and pat dry.     Driving Restrictions    Complete by:  As directed   No driving for 2 weeks     Increase activity slowly    Complete by:  As directed   Walk with assistance use walker or cane as needed     Lifting restrictions    Complete by:  As directed   No lifting for 4 weeks     Resume previous diet    Complete by:  As directed            Discharge Diagnosis:  Abdominal aortic aneurysm (AAA) >39 mm diameter [I71.4]  Secondary Diagnosis: Patient Active Problem List   Diagnosis Date Noted  . Abdominal aortic aneurysm (AAA) >39 mm diameter   . Status post percutaneous abdominal aortic aneurysm repair   . Abdominal aortic aneurysm without rupture 07/16/2014  . Atrial tachycardia, paroxysmal 07/16/2014   Past Medical History  Diagnosis Date  . Calculus of gallbladder with cholecystitis   . AAA (abdominal aortic aneurysm)   . Arthritis   . Diabetes mellitus without complication   . Esophageal reflux   . Hearing loss of aging   . Hyperlipidemia   . Thyroid disease        Medication List    TAKE these medications        albuterol 108 (90 BASE) MCG/ACT inhaler  Commonly known as:   PROVENTIL HFA;VENTOLIN HFA  Inhale into the lungs every 6 (six) hours as needed for wheezing or shortness of breath.     amLODipine 10 MG tablet  Commonly known as:  NORVASC  Take 10 mg by mouth at bedtime.     HYDROcodone-acetaminophen 5-325 MG per tablet  Commonly known as:  NORCO/VICODIN  Take 1-2 tablets by mouth every 4 (four) hours as needed for moderate pain or severe pain.     LEVOTHROID 50 MCG tablet  Generic drug:  levothyroxine  Take 50 mcg by mouth daily before breakfast.     losartan-hydrochlorothiazide 100-12.5 MG per tablet  Commonly known as:  HYZAAR  Take 1 tablet by mouth daily.     metFORMIN 1000 MG tablet  Commonly known as:  GLUCOPHAGE  Take 1,000 mg by mouth 2 (two) times daily with a meal.     omeprazole 40 MG capsule  Commonly known as:  PRILOSEC  Take 40 mg by mouth daily.     ondansetron 4 MG tablet  Commonly known as:  ZOFRAN  Take 4 mg by mouth every 8 (eight) hours as needed for nausea or vomiting.     pravastatin 40 MG tablet  Commonly known as:  PRAVACHOL  Take 40 mg by mouth at bedtime.        Vicodin  #30 No Refill  Disposition: Home with home health PT/OT  Patient's condition: is Good  Follow up: 1. Dr. Darrick Penna in 3 weeks with CT without contrast   Maris Berger, PA-C [facility] [phone] 07/28/2014  10:42 AM   - For VQI Registry use --- Instructions: Press F2 to tab through selections.  Delete question if not applicable.   Post-op:  Time to Extubation: [x ] In OR, [ ]  < 12 hrs, [ ]  12-24 hrs, [ ]  >=24 hrs Vasopressors Req. Post-op: No MI: No., [ ]  Troponin only, [ ]  EKG or Clinical New Arrhythmia: Yes CHF: Yes ICU Stay: 0 days Transfusion: Yes  If yes, 1 units given  Complications: Resp failure: No., [ ]  Pneumonia, [ ]  Ventilator Chg in renal function: No., [ ]  Inc. Cr > 0.5, [ ]  Temp. Dialysis, [ ]  Permanent dialysis Leg ischemia: No., no Surgery needed, [ ]  Yes, Surgery needed, [ ]   Amputation Bowel ischemia: No., [ ]  Medical Rx, [ ]  Surgical Rx Wound complication: No., [ ]  Superficial separation/infection, [ ]  Return to OR Return to OR: No  Return to OR for bleeding: No Stroke: No., [ ]  Minor, [ ]  Major  Discharge medications: Statin use:  Yes If No: [ ]  For Medical reasons, [ ]  Non-compliant, [ ]  Not-indicated ASA use:  No  If No: [ ]  For Medical reasons, [ ]  Non-compliant, [x]  Not-indicated Plavix use:  No If No: [ ]  For Medical reasons, [ ]  Non-compliant, [x]  Not-indicated Beta blocker use:  No If No: [ ]  For Medical reasons, [ ]  Non-compliant, [x ] Not-indicated

## 2014-07-29 DIAGNOSIS — K219 Gastro-esophageal reflux disease without esophagitis: Secondary | ICD-10-CM | POA: Diagnosis not present

## 2014-07-29 DIAGNOSIS — M150 Primary generalized (osteo)arthritis: Secondary | ICD-10-CM | POA: Diagnosis not present

## 2014-07-29 DIAGNOSIS — J449 Chronic obstructive pulmonary disease, unspecified: Secondary | ICD-10-CM | POA: Diagnosis not present

## 2014-07-29 DIAGNOSIS — E119 Type 2 diabetes mellitus without complications: Secondary | ICD-10-CM | POA: Diagnosis not present

## 2014-07-29 DIAGNOSIS — Z48812 Encounter for surgical aftercare following surgery on the circulatory system: Secondary | ICD-10-CM | POA: Diagnosis not present

## 2014-07-29 DIAGNOSIS — Z48815 Encounter for surgical aftercare following surgery on the digestive system: Secondary | ICD-10-CM | POA: Diagnosis not present

## 2014-07-30 DIAGNOSIS — E119 Type 2 diabetes mellitus without complications: Secondary | ICD-10-CM | POA: Diagnosis not present

## 2014-07-30 DIAGNOSIS — J449 Chronic obstructive pulmonary disease, unspecified: Secondary | ICD-10-CM | POA: Diagnosis not present

## 2014-07-30 DIAGNOSIS — Z48815 Encounter for surgical aftercare following surgery on the digestive system: Secondary | ICD-10-CM | POA: Diagnosis not present

## 2014-07-30 DIAGNOSIS — K219 Gastro-esophageal reflux disease without esophagitis: Secondary | ICD-10-CM | POA: Diagnosis not present

## 2014-07-30 DIAGNOSIS — M150 Primary generalized (osteo)arthritis: Secondary | ICD-10-CM | POA: Diagnosis not present

## 2014-07-30 DIAGNOSIS — Z48812 Encounter for surgical aftercare following surgery on the circulatory system: Secondary | ICD-10-CM | POA: Diagnosis not present

## 2014-07-31 DIAGNOSIS — K219 Gastro-esophageal reflux disease without esophagitis: Secondary | ICD-10-CM | POA: Diagnosis not present

## 2014-07-31 DIAGNOSIS — M150 Primary generalized (osteo)arthritis: Secondary | ICD-10-CM | POA: Diagnosis not present

## 2014-07-31 DIAGNOSIS — Z48815 Encounter for surgical aftercare following surgery on the digestive system: Secondary | ICD-10-CM | POA: Diagnosis not present

## 2014-07-31 DIAGNOSIS — Z48812 Encounter for surgical aftercare following surgery on the circulatory system: Secondary | ICD-10-CM | POA: Diagnosis not present

## 2014-07-31 DIAGNOSIS — J449 Chronic obstructive pulmonary disease, unspecified: Secondary | ICD-10-CM | POA: Diagnosis not present

## 2014-07-31 DIAGNOSIS — E119 Type 2 diabetes mellitus without complications: Secondary | ICD-10-CM | POA: Diagnosis not present

## 2014-08-04 DIAGNOSIS — J449 Chronic obstructive pulmonary disease, unspecified: Secondary | ICD-10-CM | POA: Diagnosis not present

## 2014-08-04 DIAGNOSIS — Z48815 Encounter for surgical aftercare following surgery on the digestive system: Secondary | ICD-10-CM | POA: Diagnosis not present

## 2014-08-04 DIAGNOSIS — E119 Type 2 diabetes mellitus without complications: Secondary | ICD-10-CM | POA: Diagnosis not present

## 2014-08-04 DIAGNOSIS — M150 Primary generalized (osteo)arthritis: Secondary | ICD-10-CM | POA: Diagnosis not present

## 2014-08-04 DIAGNOSIS — K219 Gastro-esophageal reflux disease without esophagitis: Secondary | ICD-10-CM | POA: Diagnosis not present

## 2014-08-04 DIAGNOSIS — Z48812 Encounter for surgical aftercare following surgery on the circulatory system: Secondary | ICD-10-CM | POA: Diagnosis not present

## 2014-08-05 DIAGNOSIS — I714 Abdominal aortic aneurysm, without rupture: Secondary | ICD-10-CM | POA: Diagnosis not present

## 2014-08-05 DIAGNOSIS — J449 Chronic obstructive pulmonary disease, unspecified: Secondary | ICD-10-CM | POA: Diagnosis not present

## 2014-08-05 DIAGNOSIS — E119 Type 2 diabetes mellitus without complications: Secondary | ICD-10-CM | POA: Diagnosis not present

## 2014-08-05 DIAGNOSIS — Z48815 Encounter for surgical aftercare following surgery on the digestive system: Secondary | ICD-10-CM | POA: Diagnosis not present

## 2014-08-05 DIAGNOSIS — K219 Gastro-esophageal reflux disease without esophagitis: Secondary | ICD-10-CM | POA: Diagnosis not present

## 2014-08-05 DIAGNOSIS — Z48812 Encounter for surgical aftercare following surgery on the circulatory system: Secondary | ICD-10-CM | POA: Diagnosis not present

## 2014-08-05 DIAGNOSIS — I10 Essential (primary) hypertension: Secondary | ICD-10-CM | POA: Diagnosis not present

## 2014-08-05 DIAGNOSIS — M150 Primary generalized (osteo)arthritis: Secondary | ICD-10-CM | POA: Diagnosis not present

## 2014-08-05 DIAGNOSIS — E785 Hyperlipidemia, unspecified: Secondary | ICD-10-CM | POA: Diagnosis not present

## 2014-08-06 ENCOUNTER — Telehealth: Payer: Self-pay

## 2014-08-06 DIAGNOSIS — M150 Primary generalized (osteo)arthritis: Secondary | ICD-10-CM | POA: Diagnosis not present

## 2014-08-06 DIAGNOSIS — Z48815 Encounter for surgical aftercare following surgery on the digestive system: Secondary | ICD-10-CM | POA: Diagnosis not present

## 2014-08-06 DIAGNOSIS — K219 Gastro-esophageal reflux disease without esophagitis: Secondary | ICD-10-CM | POA: Diagnosis not present

## 2014-08-06 DIAGNOSIS — E119 Type 2 diabetes mellitus without complications: Secondary | ICD-10-CM | POA: Diagnosis not present

## 2014-08-06 DIAGNOSIS — J449 Chronic obstructive pulmonary disease, unspecified: Secondary | ICD-10-CM | POA: Diagnosis not present

## 2014-08-06 DIAGNOSIS — Z48812 Encounter for surgical aftercare following surgery on the circulatory system: Secondary | ICD-10-CM | POA: Diagnosis not present

## 2014-08-06 NOTE — Telephone Encounter (Signed)
Rec'd phone call from Physical Therapist @ [facility].  Reported the pt. fell yesterday @ home, when his O2 tubing got caught, and caused him to lose his balance and fall.  Stated pt. fell prior to the PT arriving.  Per the Physical Therapist, no injury was noted from the incident.  Attempted to contact the pt.; Spoke with pt's daughter.  Stated the pt. is doing well, and no apparent injury was rec'd. from the fall.   Will make Dr. Darrick Penna aware.

## 2014-08-07 DIAGNOSIS — J449 Chronic obstructive pulmonary disease, unspecified: Secondary | ICD-10-CM | POA: Diagnosis not present

## 2014-08-07 DIAGNOSIS — M150 Primary generalized (osteo)arthritis: Secondary | ICD-10-CM | POA: Diagnosis not present

## 2014-08-07 DIAGNOSIS — Z48815 Encounter for surgical aftercare following surgery on the digestive system: Secondary | ICD-10-CM | POA: Diagnosis not present

## 2014-08-07 DIAGNOSIS — Z48812 Encounter for surgical aftercare following surgery on the circulatory system: Secondary | ICD-10-CM | POA: Diagnosis not present

## 2014-08-07 DIAGNOSIS — K219 Gastro-esophageal reflux disease without esophagitis: Secondary | ICD-10-CM | POA: Diagnosis not present

## 2014-08-07 DIAGNOSIS — E119 Type 2 diabetes mellitus without complications: Secondary | ICD-10-CM | POA: Diagnosis not present

## 2014-08-11 DIAGNOSIS — Z48812 Encounter for surgical aftercare following surgery on the circulatory system: Secondary | ICD-10-CM | POA: Diagnosis not present

## 2014-08-11 DIAGNOSIS — J449 Chronic obstructive pulmonary disease, unspecified: Secondary | ICD-10-CM | POA: Diagnosis not present

## 2014-08-11 DIAGNOSIS — E119 Type 2 diabetes mellitus without complications: Secondary | ICD-10-CM | POA: Diagnosis not present

## 2014-08-11 DIAGNOSIS — M150 Primary generalized (osteo)arthritis: Secondary | ICD-10-CM | POA: Diagnosis not present

## 2014-08-11 DIAGNOSIS — K219 Gastro-esophageal reflux disease without esophagitis: Secondary | ICD-10-CM | POA: Diagnosis not present

## 2014-08-11 DIAGNOSIS — Z48815 Encounter for surgical aftercare following surgery on the digestive system: Secondary | ICD-10-CM | POA: Diagnosis not present

## 2014-08-13 ENCOUNTER — Ambulatory Visit
Admission: RE | Admit: 2014-08-13 | Discharge: 2014-08-13 | Disposition: A | Discharge status: Home / Self Care | Payer: Medicare Other | Source: Ambulatory Visit | Attending: Vascular Surgery | Admitting: Vascular Surgery

## 2014-08-13 ENCOUNTER — Other Ambulatory Visit: Payer: Medicare Other

## 2014-08-13 DIAGNOSIS — I714 Abdominal aortic aneurysm, without rupture, unspecified: Secondary | ICD-10-CM

## 2014-08-13 DIAGNOSIS — Z9049 Acquired absence of other specified parts of digestive tract: Secondary | ICD-10-CM | POA: Diagnosis not present

## 2014-08-13 DIAGNOSIS — Z48812 Encounter for surgical aftercare following surgery on the circulatory system: Secondary | ICD-10-CM

## 2014-08-19 ENCOUNTER — Ambulatory Visit: Payer: Medicare Other | Admitting: Adult Health

## 2014-08-19 ENCOUNTER — Encounter: Payer: Self-pay | Admitting: Vascular Surgery

## 2014-08-20 ENCOUNTER — Encounter: Payer: Medicare Other | Admitting: Vascular Surgery

## 2014-08-20 ENCOUNTER — Encounter: Payer: Self-pay | Admitting: Vascular Surgery

## 2014-08-20 ENCOUNTER — Ambulatory Visit (INDEPENDENT_AMBULATORY_CARE_PROVIDER_SITE_OTHER): Payer: Medicare Other | Admitting: Vascular Surgery

## 2014-08-20 VITALS — BP 108/62 | HR 138 | Temp 97.4°F | Ht 66.0 in | Wt 125.0 lb

## 2014-08-20 DIAGNOSIS — I714 Abdominal aortic aneurysm, without rupture, unspecified: Secondary | ICD-10-CM

## 2014-08-20 NOTE — Progress Notes (Signed)
Patient is a 81 year old male who is status post endovascular aneurysm repair with a Gore Excluder stent graft 07/15/2014. He subsequently underwent cholecystectomy during the same hospital admission. He has returned home. He is still a little bit deconditioned but is getting up and around but just not as strong as he was before hospital admission. He denies any abdominal or back pain.  Filed Vitals:   08/20/14 1540  BP: 108/62  Pulse: 138  Temp: 97.4 F (36.3 C)  TempSrc: Oral  Height: 5\' 6"  (1.676 m)  Weight: 125 lb (56.7 kg)  SpO2: 96%    Abdomen: Soft nontender aneurysm is palpable but seems to be less pulsatile than before. He has 2+ femoral pulses bilaterally  Data: Noncontrast CT scan of abdomen and pelvis was performed on 08/13/2014. This showed the stent graft is in good position. This was done without contrast due to renal dysfunction.  Assessment: Doing well status post endovascular repair of abdominal aortic aneurysm  Plan: Follow-up in 6 months with aortic ultrasound for further evaluation of his aneurysm stent graft.  Fabienne Bruns, MD [facility] [place] Office: [phone] Pager: [phone]

## 2014-08-21 ENCOUNTER — Ambulatory Visit: Payer: Medicare Other | Admitting: Adult Health

## 2014-08-21 NOTE — Addendum Note (Signed)
Addended by: Adria Dill L on: 08/21/2014 11:40 AM   Modules accepted: Orders

## 2014-08-24 ENCOUNTER — Ambulatory Visit (INDEPENDENT_AMBULATORY_CARE_PROVIDER_SITE_OTHER): Payer: Medicare Other | Admitting: Adult Health

## 2014-08-24 ENCOUNTER — Encounter: Payer: Self-pay | Admitting: Adult Health

## 2014-08-24 VITALS — BP 128/68 | HR 91 | Temp 98.5°F | Ht 66.0 in | Wt 122.0 lb

## 2014-08-24 DIAGNOSIS — J849 Interstitial pulmonary disease, unspecified: Secondary | ICD-10-CM

## 2014-08-24 DIAGNOSIS — I270 Primary pulmonary hypertension: Secondary | ICD-10-CM

## 2014-08-24 DIAGNOSIS — I272 Pulmonary hypertension, unspecified: Secondary | ICD-10-CM

## 2014-08-24 NOTE — Patient Instructions (Addendum)
Wear Oxygen with activity and At bedtime   Check with your family doctor that your Pneumonia vaccines are up to date.  Follow up Dr. Vassie Loll  In 3 months and As needed   Please contact office for sooner follow up if symptoms do not improve or worsen or seek emergency care

## 2014-08-25 DIAGNOSIS — I272 Pulmonary hypertension, unspecified: Secondary | ICD-10-CM | POA: Insufficient documentation

## 2014-08-25 DIAGNOSIS — J849 Interstitial pulmonary disease, unspecified: Secondary | ICD-10-CM | POA: Insufficient documentation

## 2014-08-25 NOTE — Assessment & Plan Note (Signed)
We'll hold off on spirometry or pulmonary function test at this time Plan  Wear Oxygen with activity and At bedtime   Check with your family doctor that your Pneumonia vaccines are up to date.  Follow up Dr. Vassie Loll  In 3 months and As needed   Please contact office for sooner follow up if symptoms do not improve or worsen or seek emergency care

## 2014-08-25 NOTE — Assessment & Plan Note (Signed)
Patient is to continue on oxygen with activity and at bedtime to keep O2 saturations above 90% Patient is not a candidate for pulmonary arterial vasodilator therapy

## 2014-08-25 NOTE — Progress Notes (Signed)
Subjective:    Patient ID: Christian Dominguez, male    DOB: 10/25/1921, 81 y.o.   MRN: [id number]  HPI 81yo M admitted 07/15/14 with AAA repair and was found have gal stone pancreatitis, while in the hospital was noted to be desaturating and PCCM was consulted.  CT chest showed interstitial lung disease with extensive areas of subpleural reticulation with traction bronchiectasis and frank honeycombing and ground glass attenuation in the mid to upper lungs.  STUDIES:  CT abd: lower lobe cystic changes with some intralobular septal thickening and groundglass. CXR: evidence of some minor pulmonary edema but fibrosis present. TTE: Preserved systolic function with indeterminate LV filling pressure. Flattening of the LV septum with moderately dilated RV and peak pulmonary artery pressure 65 mmHg plus right atrial pressure. Trivial aortic regurgitation and mild mitral regurgitation were noted.   08/24/14 Post Hospital follow up :  Patient returns for a post hospital follow-up Patient was seen in the hospital for a pulmonary consult due to hypoxia and abnormal CT.  Patient was admitted on July 13 for a AAA repair and found to have gallstone pancreatitis. Requiring cholecystectomy He was noticed to have desaturations. A CT abdomen did show some severe and chronic fibrosis in the lung windows. Patient was seen by our pulmonary department and set up for a CT chest that showed interstitial lung disease. A 2-D echo did show pulmonary hypertension with PA pressures at 65. Patient has said that he had shortness of breath for years but did not seek medical attention. Patient was recommended to begin oxygen with activity and at bedtime. Since discharge. Patient says that he is doing well. He is recovering from his abdominal surgeries. He remains weak but is starting to regain his activity levels. Prior to admission. Patient was pretty active and independent. He denies any chest pain, orthopnea, PND, or increased  leg swelling.    Review of Systems Constitutional:   No  weight loss, night sweats,  Fevers, chills, fatigue, or  lassitude.  HEENT:   No headaches,  Difficulty swallowing,  Tooth/dental problems, or  Sore throat,                No sneezing, itching, ear ache, nasal congestion, post nasal drip,   CV:  No chest pain,  Orthopnea, PND, swelling in lower extremities, anasarca, dizziness, palpitations, syncope.   GI  No heartburn, indigestion, abdominal pain, nausea, vomiting, diarrhea, change in bowel habits, loss of appetite, bloody stools.   Resp:  .  No excess mucus, no productive cough,  No non-productive cough,  No coughing up of blood.  No change in color of mucus.  No wheezing.  No chest wall deformity  Skin: no rash or lesions.  GU: no dysuria, change in color of urine, no urgency or frequency.  No flank pain, no hematuria   MS:  No joint pain or swelling.  No decreased range of motion.  No back pain.  Psych:  No change in mood or affect. No depression or anxiety.  No memory loss.         Objective:   Physical Exam GEN: A/Ox3; pleasant , NAD, frail and elderly  Vital signs reviewed HEENT:  Startup/AT,  EACs-clear, TMs-wnl, NOSE-clear, THROAT-clear, no lesions, no postnasal drip or exudate noted.   NECK:  Supple w/ fair ROM; no JVD; normal carotid impulses w/o bruits; no thyromegaly or nodules palpated; no lymphadenopathy.  RESP  faint bibasilar crackles no accessory muscle use, no dullness to percussion  CARD:  RRR, no m/r/g  , no peripheral edema, pulses intact, no cyanosis or clubbing.  GI:   Soft & nt; nml bowel sounds; no organomegaly or masses detected.  Musco: Warm bil, no deformities or joint swelling noted.   Neuro: alert, no focal deficits noted.    Skin: Warm, no lesions or rashes         Assessment & Plan:

## 2014-09-08 DIAGNOSIS — Z9181 History of falling: Secondary | ICD-10-CM | POA: Diagnosis not present

## 2014-09-08 DIAGNOSIS — R197 Diarrhea, unspecified: Secondary | ICD-10-CM | POA: Diagnosis not present

## 2014-09-08 DIAGNOSIS — N289 Disorder of kidney and ureter, unspecified: Secondary | ICD-10-CM | POA: Diagnosis not present

## 2014-09-08 DIAGNOSIS — E039 Hypothyroidism, unspecified: Secondary | ICD-10-CM | POA: Diagnosis not present

## 2014-09-08 DIAGNOSIS — R634 Abnormal weight loss: Secondary | ICD-10-CM | POA: Diagnosis not present

## 2014-09-08 DIAGNOSIS — I10 Essential (primary) hypertension: Secondary | ICD-10-CM | POA: Diagnosis not present

## 2014-09-08 DIAGNOSIS — E1122 Type 2 diabetes mellitus with diabetic chronic kidney disease: Secondary | ICD-10-CM | POA: Diagnosis not present

## 2014-09-08 DIAGNOSIS — Z1389 Encounter for screening for other disorder: Secondary | ICD-10-CM | POA: Diagnosis not present

## 2014-09-22 DIAGNOSIS — I499 Cardiac arrhythmia, unspecified: Secondary | ICD-10-CM | POA: Diagnosis not present

## 2014-09-22 DIAGNOSIS — N289 Disorder of kidney and ureter, unspecified: Secondary | ICD-10-CM | POA: Diagnosis not present

## 2014-09-22 DIAGNOSIS — R197 Diarrhea, unspecified: Secondary | ICD-10-CM | POA: Diagnosis not present

## 2014-09-22 DIAGNOSIS — K219 Gastro-esophageal reflux disease without esophagitis: Secondary | ICD-10-CM | POA: Diagnosis not present

## 2014-09-22 DIAGNOSIS — R634 Abnormal weight loss: Secondary | ICD-10-CM | POA: Diagnosis not present

## 2014-10-05 DIAGNOSIS — I10 Essential (primary) hypertension: Secondary | ICD-10-CM | POA: Diagnosis not present

## 2014-10-05 DIAGNOSIS — R197 Diarrhea, unspecified: Secondary | ICD-10-CM | POA: Diagnosis not present

## 2014-10-05 DIAGNOSIS — D539 Nutritional anemia, unspecified: Secondary | ICD-10-CM | POA: Diagnosis not present

## 2014-10-05 DIAGNOSIS — N289 Disorder of kidney and ureter, unspecified: Secondary | ICD-10-CM | POA: Diagnosis not present

## 2014-10-05 DIAGNOSIS — R634 Abnormal weight loss: Secondary | ICD-10-CM | POA: Diagnosis not present

## 2014-10-05 DIAGNOSIS — J8410 Pulmonary fibrosis, unspecified: Secondary | ICD-10-CM | POA: Diagnosis not present

## 2014-10-09 DIAGNOSIS — I517 Cardiomegaly: Secondary | ICD-10-CM | POA: Diagnosis not present

## 2014-10-09 DIAGNOSIS — J8489 Other specified interstitial pulmonary diseases: Secondary | ICD-10-CM | POA: Diagnosis not present

## 2014-10-09 DIAGNOSIS — D72828 Other elevated white blood cell count: Secondary | ICD-10-CM | POA: Diagnosis not present

## 2014-10-13 DIAGNOSIS — Z23 Encounter for immunization: Secondary | ICD-10-CM | POA: Diagnosis not present

## 2014-11-09 DIAGNOSIS — E785 Hyperlipidemia, unspecified: Secondary | ICD-10-CM | POA: Diagnosis not present

## 2014-11-09 DIAGNOSIS — E1122 Type 2 diabetes mellitus with diabetic chronic kidney disease: Secondary | ICD-10-CM | POA: Diagnosis not present

## 2014-11-09 DIAGNOSIS — I10 Essential (primary) hypertension: Secondary | ICD-10-CM | POA: Diagnosis not present

## 2014-11-09 DIAGNOSIS — R634 Abnormal weight loss: Secondary | ICD-10-CM | POA: Diagnosis not present

## 2014-11-09 DIAGNOSIS — R197 Diarrhea, unspecified: Secondary | ICD-10-CM | POA: Diagnosis not present

## 2014-11-09 DIAGNOSIS — J8410 Pulmonary fibrosis, unspecified: Secondary | ICD-10-CM | POA: Diagnosis not present

## 2014-11-09 DIAGNOSIS — D72829 Elevated white blood cell count, unspecified: Secondary | ICD-10-CM | POA: Diagnosis not present

## 2014-11-09 DIAGNOSIS — D539 Nutritional anemia, unspecified: Secondary | ICD-10-CM | POA: Diagnosis not present

## 2014-11-09 DIAGNOSIS — E039 Hypothyroidism, unspecified: Secondary | ICD-10-CM | POA: Diagnosis not present

## 2015-01-07 DIAGNOSIS — J8410 Pulmonary fibrosis, unspecified: Secondary | ICD-10-CM | POA: Diagnosis not present

## 2015-01-07 DIAGNOSIS — D72829 Elevated white blood cell count, unspecified: Secondary | ICD-10-CM | POA: Diagnosis not present

## 2015-01-07 DIAGNOSIS — E039 Hypothyroidism, unspecified: Secondary | ICD-10-CM | POA: Diagnosis not present

## 2015-01-07 DIAGNOSIS — I499 Cardiac arrhythmia, unspecified: Secondary | ICD-10-CM | POA: Diagnosis not present

## 2015-01-07 DIAGNOSIS — I10 Essential (primary) hypertension: Secondary | ICD-10-CM | POA: Diagnosis not present

## 2015-01-07 DIAGNOSIS — Z6820 Body mass index (BMI) 20.0-20.9, adult: Secondary | ICD-10-CM | POA: Diagnosis not present

## 2015-01-07 DIAGNOSIS — E1122 Type 2 diabetes mellitus with diabetic chronic kidney disease: Secondary | ICD-10-CM | POA: Diagnosis not present

## 2015-01-15 ENCOUNTER — Encounter: Payer: Self-pay | Admitting: Family

## 2015-01-21 ENCOUNTER — Other Ambulatory Visit (HOSPITAL_COMMUNITY): Payer: Medicare Other

## 2015-01-21 ENCOUNTER — Ambulatory Visit: Payer: Medicare Other | Admitting: Family

## 2015-01-27 DIAGNOSIS — L570 Actinic keratosis: Secondary | ICD-10-CM | POA: Diagnosis not present

## 2015-01-27 DIAGNOSIS — L821 Other seborrheic keratosis: Secondary | ICD-10-CM | POA: Diagnosis not present

## 2015-01-27 DIAGNOSIS — Z85828 Personal history of other malignant neoplasm of skin: Secondary | ICD-10-CM | POA: Diagnosis not present

## 2015-02-10 ENCOUNTER — Ambulatory Visit: Payer: Medicare Other | Admitting: Pulmonary Disease

## 2015-04-06 ENCOUNTER — Encounter: Payer: Self-pay | Admitting: Family

## 2015-04-08 DIAGNOSIS — E785 Hyperlipidemia, unspecified: Secondary | ICD-10-CM | POA: Diagnosis not present

## 2015-04-08 DIAGNOSIS — Z6820 Body mass index (BMI) 20.0-20.9, adult: Secondary | ICD-10-CM | POA: Diagnosis not present

## 2015-04-08 DIAGNOSIS — E039 Hypothyroidism, unspecified: Secondary | ICD-10-CM | POA: Diagnosis not present

## 2015-04-08 DIAGNOSIS — J8410 Pulmonary fibrosis, unspecified: Secondary | ICD-10-CM | POA: Diagnosis not present

## 2015-04-08 DIAGNOSIS — E1122 Type 2 diabetes mellitus with diabetic chronic kidney disease: Secondary | ICD-10-CM | POA: Diagnosis not present

## 2015-04-08 DIAGNOSIS — N289 Disorder of kidney and ureter, unspecified: Secondary | ICD-10-CM | POA: Diagnosis not present

## 2015-04-08 DIAGNOSIS — K219 Gastro-esophageal reflux disease without esophagitis: Secondary | ICD-10-CM | POA: Diagnosis not present

## 2015-04-08 DIAGNOSIS — I10 Essential (primary) hypertension: Secondary | ICD-10-CM | POA: Diagnosis not present

## 2015-04-14 ENCOUNTER — Encounter: Payer: Self-pay | Admitting: Family

## 2015-04-14 ENCOUNTER — Ambulatory Visit (INDEPENDENT_AMBULATORY_CARE_PROVIDER_SITE_OTHER): Payer: Medicare Other | Admitting: Family

## 2015-04-14 ENCOUNTER — Ambulatory Visit (HOSPITAL_COMMUNITY)
Admission: RE | Admit: 2015-04-14 | Discharge: 2015-04-14 | Disposition: A | Discharge status: Home / Self Care | Payer: Medicare Other | Source: Ambulatory Visit | Attending: Family | Admitting: Family

## 2015-04-14 VITALS — BP 133/65 | HR 73 | Temp 97.9°F | Resp 16 | Ht 66.0 in | Wt 126.0 lb

## 2015-04-14 DIAGNOSIS — E119 Type 2 diabetes mellitus without complications: Secondary | ICD-10-CM | POA: Diagnosis not present

## 2015-04-14 DIAGNOSIS — Z4889 Encounter for other specified surgical aftercare: Secondary | ICD-10-CM | POA: Diagnosis not present

## 2015-04-14 DIAGNOSIS — Z9889 Other specified postprocedural states: Secondary | ICD-10-CM | POA: Diagnosis not present

## 2015-04-14 DIAGNOSIS — Z48812 Encounter for surgical aftercare following surgery on the circulatory system: Secondary | ICD-10-CM

## 2015-04-14 DIAGNOSIS — I714 Abdominal aortic aneurysm, without rupture, unspecified: Secondary | ICD-10-CM

## 2015-04-14 DIAGNOSIS — Z95828 Presence of other vascular implants and grafts: Secondary | ICD-10-CM | POA: Diagnosis not present

## 2015-04-14 DIAGNOSIS — E785 Hyperlipidemia, unspecified: Secondary | ICD-10-CM | POA: Diagnosis not present

## 2015-04-14 NOTE — Progress Notes (Signed)
[facility]  Established [facility]  History of Present Illness  Christian Dominguez is a 82 y.o. (28-Aug-1921) male patient of Dr. Darrick Penna who is status post endovascular aneurysm repair with a Gore Excluder stent graft 07/15/2014. He subsequently underwent cholecystectomy during the same hospital admission.   Most recent non contrast CTA (Date: 08/13/14) demonstrates: no endoleak and 6.9 x 6.2 cm sac size.  The patient has no had back or abdominal pain.  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   Pt Diabetic: No Pt smoker: non-smoker   Past Medical History  Diagnosis Date  . Calculus of gallbladder with cholecystitis   . AAA (abdominal aortic aneurysm) (HCC)   . Arthritis   . Diabetes mellitus without complication (HCC)   . Esophageal reflux   . Hearing loss of aging   . Hyperlipidemia   . Thyroid disease    Past Surgical History  Procedure Laterality Date  . Abdominal aortic endovascular stent graft N/A 07/15/2014    Procedure: ABDOMINAL AORTIC ENDOVASCULAR STENT GRAFT;  Surgeon: Sherren Kerns, MD;  Location[facility] OR;  Service: Vascular;  Laterality: N/A;  . Cholecystectomy N/A 07/20/2014    Procedure: LAPAROSCOPIC CHOLECYSTECTOMY WITH INTRAOPERATIVE CHOLANGIOGRAM;  Surgeon: Violeta Gelinas, MD;  Location: [facility] OR;  Service: General;  Laterality: N/A;   Social History Social History  Substance Use Topics  . Smoking status: Never Smoker   . Smokeless tobacco: None  . Alcohol Use: No   Family History Family History  Problem Relation Age of Onset  . Heart disease Father    Current Outpatient Prescriptions on File Prior to Visit  Medication Sig Dispense Refill  . amLODipine (NORVASC) 10 MG tablet Take 10 mg by mouth at bedtime.     Marland Kitchen levothyroxine (LEVOTHROID) 50 MCG tablet Take 50 mcg by mouth daily before breakfast.    . losartan-hydrochlorothiazide (HYZAAR) 100-12.5 MG per tablet Take 1 tablet by  mouth daily.    . metFORMIN (GLUCOPHAGE) 1000 MG tablet Take 1,000 mg by mouth 2 (two) times daily with a meal.    . omeprazole (PRILOSEC) 40 MG capsule Take 40 mg by mouth daily.    . ondansetron (ZOFRAN) 4 MG tablet Take 4 mg by mouth every 8 (eight) hours as needed for nausea or vomiting.    . pravastatin (PRAVACHOL) 40 MG tablet Take 40 mg by mouth at bedtime.     Marland Kitchen albuterol (PROVENTIL HFA;VENTOLIN HFA) 108 (90 BASE) MCG/ACT inhaler Inhale into the lungs every 6 (six) hours as needed for wheezing or shortness of breath. Reported on 04/14/2015    . HYDROcodone-acetaminophen (NORCO/VICODIN) 5-325 MG per tablet Take 1-2 tablets by mouth every 4 (four) hours as needed for moderate pain or severe pain. (Patient not taking: Reported on 08/24/2014) 30 tablet 0   No current facility-administered medications on file prior to visit.   No Known Allergies   ROS: See HPI for pertinent positives and negatives.  Physical Examination  Filed Vitals:   04/14/15 1015  BP: 133/65  Pulse: 73  Temp: 97.9 F (36.6 C)  TempSrc: Oral  Resp: 16  Height:  (1.676 m)  Weight: 126 lb (57.153 kg)  SpO2: 97%   Body mass index is 20.35 kg/(m^2).  General: A&O x 3, WD, thin elderly male  Pulmonary: Sym exp, respirations are non labored, limited air movement in all fields, no rales,  + rhonchi, - wheezing. He is wearing nasal cannula with supplemental  portable oxygen.  Cardiac: RRR, Nl S1, S2, no murmur appreciated  Vascular: Vessel Right Left  Radial Palpable Palpable  Brachial Palpable Palpable  Carotid  without bruit  without bruit  Aorta moderatly palpable (pt is thin) N/A  Femoral Palpable Palpable  Popliteal Not palpable Not palpable  PT Palpable Palpable  DP Palpable Palpable   Gastrointestinal: soft, NTND, -G/R, - HSM, - palpable masses, - CVAT B.  Musculoskeletal: M/S 5/5 throughout, extremities without ischemic changes. 1+ pitting edema in right ankle, trace in left.  Neurologic:  Pain and light touch intact in extremities, Motor exam as listed above. CN 2-12 grossly intact except is hard of hearing.  Non-Invasive Vascular Imaging  [facility] Duplex (Date: 04/14/2015)  AAA sac size: 5.7 cm x 6.1 cm  no endoleak detected  CT non contrast Abd/Pelvis Duplex (Date: 08/13/14) An infrarenal abdominal aortic stent graft has been placed. Stents extends into the common iliac arteries bilaterally. The infrarenal abdominal aortic aneurysm measures 6.9 x 6.2 cm and prior to surgery it measured 6.9 x 6.0 cm. An endoleak cannot be evaluated on this non contrast examination. There is no evidence for fluid or blood surrounding the aorta. The aorta at the hiatus measures up to 3 cm and minimally changed. Subtle postoperative changes in the inguinal regions bilaterally but no large fluid collections.   Medical Decision Making  [name] is a 82 y.o. male who presents s/p EVAR (Date: 07/15/14).  Pt is asymptomatic with decrease sac size compared to CTA on 08/13/14.  I discussed with the patient the importance of surveillance of the endograft.  The next endograft duplex will be scheduled for 6 months.  The patient will follow up with [place] in 6 months with these studies.  I emphasized the importance of maximal medical management including strict control of blood pressure, blood glucose, and lipid levels, antiplatelet agents, obtaining regular exercise, and cessation of smoking.   Thank you for allowing [place] to participate in this patient's care.  Charisse March, RN, MSN, FNP-C [facility] [place] Office: [phone]  Clinic Physician: Edilia Bo  04/14/2015, 10:22 AM

## 2015-05-11 DIAGNOSIS — J8410 Pulmonary fibrosis, unspecified: Secondary | ICD-10-CM | POA: Diagnosis not present

## 2015-05-11 DIAGNOSIS — Z6820 Body mass index (BMI) 20.0-20.9, adult: Secondary | ICD-10-CM | POA: Diagnosis not present

## 2015-05-11 NOTE — Addendum Note (Signed)
Addended by: Dannielle Karvonen on: 05/11/2015 02:31 PM   Modules accepted: Orders

## 2015-05-25 ENCOUNTER — Encounter: Payer: Self-pay | Admitting: Pulmonary Disease

## 2015-05-25 DIAGNOSIS — N289 Disorder of kidney and ureter, unspecified: Secondary | ICD-10-CM | POA: Diagnosis not present

## 2015-05-25 DIAGNOSIS — R069 Unspecified abnormalities of breathing: Secondary | ICD-10-CM | POA: Diagnosis not present

## 2015-05-25 DIAGNOSIS — R600 Localized edema: Secondary | ICD-10-CM | POA: Diagnosis not present

## 2015-05-25 DIAGNOSIS — Z6820 Body mass index (BMI) 20.0-20.9, adult: Secondary | ICD-10-CM | POA: Diagnosis not present

## 2015-05-25 DIAGNOSIS — E1122 Type 2 diabetes mellitus with diabetic chronic kidney disease: Secondary | ICD-10-CM | POA: Diagnosis not present

## 2015-05-25 DIAGNOSIS — J8410 Pulmonary fibrosis, unspecified: Secondary | ICD-10-CM | POA: Diagnosis not present

## 2015-05-25 DIAGNOSIS — R0609 Other forms of dyspnea: Secondary | ICD-10-CM | POA: Diagnosis not present

## 2015-06-08 DIAGNOSIS — R600 Localized edema: Secondary | ICD-10-CM | POA: Diagnosis not present

## 2015-06-08 DIAGNOSIS — Z6820 Body mass index (BMI) 20.0-20.9, adult: Secondary | ICD-10-CM | POA: Diagnosis not present

## 2015-06-08 DIAGNOSIS — J8410 Pulmonary fibrosis, unspecified: Secondary | ICD-10-CM | POA: Diagnosis not present

## 2015-07-19 DIAGNOSIS — Z6820 Body mass index (BMI) 20.0-20.9, adult: Secondary | ICD-10-CM | POA: Diagnosis not present

## 2015-07-19 DIAGNOSIS — R600 Localized edema: Secondary | ICD-10-CM | POA: Diagnosis not present

## 2015-07-19 DIAGNOSIS — R0609 Other forms of dyspnea: Secondary | ICD-10-CM | POA: Diagnosis not present

## 2015-10-26 DIAGNOSIS — E1122 Type 2 diabetes mellitus with diabetic chronic kidney disease: Secondary | ICD-10-CM | POA: Diagnosis not present

## 2015-10-26 DIAGNOSIS — Z1389 Encounter for screening for other disorder: Secondary | ICD-10-CM | POA: Diagnosis not present

## 2015-10-26 DIAGNOSIS — J8410 Pulmonary fibrosis, unspecified: Secondary | ICD-10-CM | POA: Diagnosis not present

## 2015-10-26 DIAGNOSIS — Z9181 History of falling: Secondary | ICD-10-CM | POA: Diagnosis not present

## 2015-10-26 DIAGNOSIS — Z23 Encounter for immunization: Secondary | ICD-10-CM | POA: Diagnosis not present

## 2015-10-26 DIAGNOSIS — Z6820 Body mass index (BMI) 20.0-20.9, adult: Secondary | ICD-10-CM | POA: Diagnosis not present

## 2015-10-26 DIAGNOSIS — D539 Nutritional anemia, unspecified: Secondary | ICD-10-CM | POA: Diagnosis not present

## 2015-10-26 DIAGNOSIS — I10 Essential (primary) hypertension: Secondary | ICD-10-CM | POA: Diagnosis not present

## 2015-11-03 ENCOUNTER — Encounter: Payer: Self-pay | Admitting: Family

## 2015-11-04 ENCOUNTER — Ambulatory Visit (HOSPITAL_COMMUNITY)
Admission: RE | Admit: 2015-11-04 | Discharge: 2015-11-04 | Disposition: A | Discharge status: Home / Self Care | Payer: Medicare Other | Source: Ambulatory Visit | Attending: Vascular Surgery | Admitting: Vascular Surgery

## 2015-11-04 ENCOUNTER — Encounter: Payer: Self-pay | Admitting: Family

## 2015-11-04 ENCOUNTER — Ambulatory Visit (INDEPENDENT_AMBULATORY_CARE_PROVIDER_SITE_OTHER): Payer: Medicare Other | Admitting: Family

## 2015-11-04 VITALS — BP 123/61 | HR 83 | Temp 96.7°F | Ht 66.0 in | Wt 124.7 lb

## 2015-11-04 DIAGNOSIS — Z4889 Encounter for other specified surgical aftercare: Secondary | ICD-10-CM

## 2015-11-04 DIAGNOSIS — I714 Abdominal aortic aneurysm, without rupture, unspecified: Secondary | ICD-10-CM

## 2015-11-04 DIAGNOSIS — Z95828 Presence of other vascular implants and grafts: Secondary | ICD-10-CM

## 2015-11-04 DIAGNOSIS — Z48812 Encounter for surgical aftercare following surgery on the circulatory system: Secondary | ICD-10-CM

## 2015-11-04 NOTE — Patient Instructions (Signed)
Before your next abdominal ultrasound:  Take two Extra-Strength Gas-X capsules at bedtime the night before the test. Take another two Extra-Strength Gas-X capsules 3 hours before the test.   

## 2015-11-04 NOTE — Progress Notes (Signed)
[facility] [place]  CC: Follow up s/p EVAR  History of Present Illness  Christian Dominguez is a 82 y.o. (July 13, 1921) male patient of Dr. Darrick Penna who is status post endovascular aneurysm repair with a Gore Excluder stent graft 07/15/2014. He subsequently underwent cholecystectomy during the same hospital admission.   Most recent non contrast CTA (Date: 08/13/14) demonstrates: no endoleak and 6.9 x 6.2 cm sac size.  The patient has no had back or abdominal pain.  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  Pt Diabetic: yes Pt smoker: non-smoker    Past Medical History:  Diagnosis Date  . AAA (abdominal aortic aneurysm) (HCC)   . Arthritis   . Calculus of gallbladder with cholecystitis   . Diabetes mellitus without complication (HCC)   . Esophageal reflux   . Hearing loss of aging   . Hyperlipidemia   . Thyroid disease    Past Surgical History:  Procedure Laterality Date  . ABDOMINAL AORTIC ENDOVASCULAR STENT GRAFT N/A 07/15/2014   Procedure: ABDOMINAL AORTIC ENDOVASCULAR STENT GRAFT;  Surgeon: Sherren Kerns, MD;  Location: [facility] OR;  Service: Vascular;  Laterality: N/A;  . CHOLECYSTECTOMY N/A 07/20/2014   Procedure: LAPAROSCOPIC CHOLECYSTECTOMY WITH INTRAOPERATIVE CHOLANGIOGRAM;  Surgeon: Violeta Gelinas, MD;  Location: [facility] OR;  Service: General;  Laterality: N/A;   Social History Social History  Substance Use Topics  . Smoking status: Never Smoker  . Smokeless tobacco: Not on file  . Alcohol use No   Family History Family History  Problem Relation Age of Onset  . Heart disease Father    Current Outpatient Prescriptions on File Prior to Visit  Medication Sig Dispense Refill  . albuterol (PROVENTIL HFA;VENTOLIN HFA) 108 (90 BASE) MCG/ACT inhaler Inhale into the lungs every 6 (six) hours as needed for wheezing or shortness of breath. Reported on 04/14/2015    . amLODipine (NORVASC)  10 MG tablet Take 10 mg by mouth at bedtime.     Marland Kitchen glimepiride (AMARYL) 1 MG tablet     . HYDROcodone-acetaminophen (NORCO/VICODIN) 5-325 MG per tablet Take 1-2 tablets by mouth every 4 (four) hours as needed for moderate pain or severe pain. 30 tablet 0  . levothyroxine (LEVOTHROID) 50 MCG tablet Take 50 mcg by mouth daily before breakfast.    . losartan-hydrochlorothiazide (HYZAAR) 100-12.5 MG per tablet Take 1 tablet by mouth daily.    . metFORMIN (GLUCOPHAGE) 1000 MG tablet Take 1,000 mg by mouth 2 (two) times daily with a meal.    . omeprazole (PRILOSEC) 40 MG capsule Take 40 mg by mouth daily.    . ondansetron (ZOFRAN) 4 MG tablet Take 4 mg by mouth every 8 (eight) hours as needed for nausea or vomiting.    . pravastatin (PRAVACHOL) 40 MG tablet Take 40 mg by mouth at bedtime.      No current facility-administered medications on file prior to visit.    No Known Allergies   ROS: See HPI for pertinent positives and negatives.  Physical Examination  Vitals:   11/04/15 0954  BP: 123/61  Pulse: 83  Temp: (!) 96.7 F (35.9 C)  TempSrc: Oral  SpO2: 98%  Weight: 124 lb 11.2 oz (56.6 kg)  Height: 5\' 6"  (1.676 m)   Body mass index is 20.13 kg/m.  General: A&O x 3, WD, thin elderly male  Pulmonary: Sym exp, respirations are non labored, limited air movement in all fields, no rales,  +  rhonchi, - wheezing. He is wearing nasal cannula with supplemental portable oxygen.  Cardiac: RRR, Nl S1, S2, no murmur appreciated  Vascular: Vessel Right Left  Radial Palpable Palpable  Brachial Palpable Palpable  Carotid  without bruit  without bruit  Aorta moderatly palpable (pt is thin) N/A  Femoral Palpable Palpable  Popliteal Not palpable Not palpable  PT Palpable Palpable  DP Palpable Palpable   Gastrointestinal: soft, NTND, -G/R, - HSM, - palpable masses, - CVAT B.  Musculoskeletal: M/S 5/5 throughout, extremities without ischemic changes. 1+ pitting edema in right ankle,  trace in left.  Neurologic: Pain and light touch intact in extremities, Motor exam as listed above. CN 2-12 grossly intact except is hard of hearing.   Non-Invasive Vascular Imaging  EVAR Duplex (Date: 11/04/15)  AAA sac size: 5.7 cm x 5.9 cm  no endoleak detected  Normal diameters of bilateral CIA's   Decreased visualization of the abdominal vasculature due to overlying bowel gas.  04/14/15: 5.7 cm x 6.1 cm   Medical Decision Making  Christian Dominguez is a 82 y.o. male who presents s/p EVAR (Date: 07/15/14).  Pt is asymptomatic with stable sac size.  I discussed with the patient the importance of surveillance of the endograft.  The next endograft duplex will be scheduled for 12 months.  The patient will follow up with us in 12 months with these studies.  I emphasized the importance of maximal medical management including strict control of blood pressure, blood glucose, and lipid levels, antiplatelet agents, obtaining regular exercise, and cessation of smoking.   Thank you for allowing us to participate in this patient's care.  Charisse MarchSuzanne Lashana Spang, RN, MSN, FNP-C [facility] [place] Office: [phone]  Clinic Physician: Darrick PennaFields  11/04/2015, 10:17 AM

## 2015-11-08 DIAGNOSIS — D539 Nutritional anemia, unspecified: Secondary | ICD-10-CM | POA: Diagnosis not present

## 2015-11-24 NOTE — Addendum Note (Signed)
Addended by: Burton Apley A on: 11/24/2015 08:46 AM   Modules accepted: Orders

## 2016-01-01 ENCOUNTER — Inpatient Hospital Stay (HOSPITAL_COMMUNITY)
Admission: EM | Admit: 2016-01-01 | Discharge: 2016-01-05 | DRG: 637 | Disposition: A | Discharge status: Hospice | Payer: Medicare Other | Attending: Internal Medicine | Admitting: Internal Medicine

## 2016-01-01 ENCOUNTER — Emergency Department (HOSPITAL_COMMUNITY): Payer: Medicare Other

## 2016-01-01 DIAGNOSIS — N179 Acute kidney failure, unspecified: Secondary | ICD-10-CM | POA: Diagnosis present

## 2016-01-01 DIAGNOSIS — J439 Emphysema, unspecified: Secondary | ICD-10-CM | POA: Diagnosis present

## 2016-01-01 DIAGNOSIS — E11649 Type 2 diabetes mellitus with hypoglycemia without coma: Principal | ICD-10-CM | POA: Diagnosis present

## 2016-01-01 DIAGNOSIS — R404 Transient alteration of awareness: Secondary | ICD-10-CM | POA: Diagnosis not present

## 2016-01-01 DIAGNOSIS — R531 Weakness: Secondary | ICD-10-CM | POA: Diagnosis not present

## 2016-01-01 DIAGNOSIS — R0600 Dyspnea, unspecified: Secondary | ICD-10-CM

## 2016-01-01 DIAGNOSIS — N184 Chronic kidney disease, stage 4 (severe): Secondary | ICD-10-CM | POA: Diagnosis not present

## 2016-01-01 DIAGNOSIS — E039 Hypothyroidism, unspecified: Secondary | ICD-10-CM | POA: Diagnosis present

## 2016-01-01 DIAGNOSIS — E162 Hypoglycemia, unspecified: Secondary | ICD-10-CM

## 2016-01-01 DIAGNOSIS — R05 Cough: Secondary | ICD-10-CM | POA: Diagnosis not present

## 2016-01-01 DIAGNOSIS — E1122 Type 2 diabetes mellitus with diabetic chronic kidney disease: Secondary | ICD-10-CM | POA: Diagnosis present

## 2016-01-01 DIAGNOSIS — H9190 Unspecified hearing loss, unspecified ear: Secondary | ICD-10-CM | POA: Diagnosis present

## 2016-01-01 DIAGNOSIS — J181 Lobar pneumonia, unspecified organism: Secondary | ICD-10-CM | POA: Diagnosis present

## 2016-01-01 DIAGNOSIS — J849 Interstitial pulmonary disease, unspecified: Secondary | ICD-10-CM | POA: Diagnosis present

## 2016-01-01 DIAGNOSIS — Z9981 Dependence on supplemental oxygen: Secondary | ICD-10-CM

## 2016-01-01 DIAGNOSIS — J9621 Acute and chronic respiratory failure with hypoxia: Secondary | ICD-10-CM | POA: Diagnosis not present

## 2016-01-01 DIAGNOSIS — Z66 Do not resuscitate: Secondary | ICD-10-CM | POA: Diagnosis present

## 2016-01-01 DIAGNOSIS — Z7984 Long term (current) use of oral hypoglycemic drugs: Secondary | ICD-10-CM

## 2016-01-01 DIAGNOSIS — I272 Pulmonary hypertension, unspecified: Secondary | ICD-10-CM | POA: Diagnosis present

## 2016-01-01 DIAGNOSIS — I129 Hypertensive chronic kidney disease with stage 1 through stage 4 chronic kidney disease, or unspecified chronic kidney disease: Secondary | ICD-10-CM | POA: Diagnosis present

## 2016-01-01 DIAGNOSIS — E860 Dehydration: Secondary | ICD-10-CM | POA: Diagnosis present

## 2016-01-01 DIAGNOSIS — R29818 Other symptoms and signs involving the nervous system: Secondary | ICD-10-CM | POA: Diagnosis not present

## 2016-01-01 DIAGNOSIS — Z79899 Other long term (current) drug therapy: Secondary | ICD-10-CM

## 2016-01-01 DIAGNOSIS — Z95828 Presence of other vascular implants and grafts: Secondary | ICD-10-CM

## 2016-01-01 DIAGNOSIS — J069 Acute upper respiratory infection, unspecified: Secondary | ICD-10-CM

## 2016-01-01 DIAGNOSIS — K219 Gastro-esophageal reflux disease without esophagitis: Secondary | ICD-10-CM | POA: Diagnosis present

## 2016-01-01 DIAGNOSIS — R4781 Slurred speech: Secondary | ICD-10-CM | POA: Diagnosis present

## 2016-01-01 DIAGNOSIS — Z79891 Long term (current) use of opiate analgesic: Secondary | ICD-10-CM

## 2016-01-01 DIAGNOSIS — D6489 Other specified anemias: Secondary | ICD-10-CM | POA: Diagnosis present

## 2016-01-01 DIAGNOSIS — E785 Hyperlipidemia, unspecified: Secondary | ICD-10-CM | POA: Diagnosis present

## 2016-01-01 DIAGNOSIS — Z77090 Contact with and (suspected) exposure to asbestos: Secondary | ICD-10-CM | POA: Diagnosis present

## 2016-01-01 HISTORY — DX: Chronic obstructive pulmonary disease, unspecified: J44.9

## 2016-01-01 LAB — I-STAT CHEM 8, ED
BUN: 64 mg/dL — ABNORMAL HIGH (ref 6–20)
CALCIUM ION: 1.12 mmol/L — AB (ref 1.15–1.40)
Chloride: 99 mmol/L — ABNORMAL LOW (ref 101–111)
Creatinine, Ser: 2.8 mg/dL — ABNORMAL HIGH (ref 0.61–1.24)
Glucose, Bld: 42 mg/dL — CL (ref 65–99)
HEMATOCRIT: 31 % — AB (ref 39.0–52.0)
Hemoglobin: 10.5 g/dL — ABNORMAL LOW (ref 13.0–17.0)
Potassium: 5.1 mmol/L (ref 3.5–5.1)
SODIUM: 131 mmol/L — AB (ref 135–145)
TCO2: 23 mmol/L (ref 0–100)

## 2016-01-01 LAB — DIFFERENTIAL
BASOS PCT: 0 %
Basophils Absolute: 0 10*3/uL (ref 0.0–0.1)
Eosinophils Absolute: 0.1 10*3/uL (ref 0.0–0.7)
Eosinophils Relative: 0 %
Lymphocytes Relative: 13 %
Lymphs Abs: 2.8 10*3/uL (ref 0.7–4.0)
MONO ABS: 1.5 10*3/uL — AB (ref 0.1–1.0)
Monocytes Relative: 7 %
NEUTROS ABS: 17.4 10*3/uL — AB (ref 1.7–7.7)
NEUTROS PCT: 80 %

## 2016-01-01 LAB — I-STAT CG4 LACTIC ACID, ED: LACTIC ACID, VENOUS: 1.11 mmol/L (ref 0.5–1.9)

## 2016-01-01 LAB — PROTIME-INR
INR: 0.98
PROTHROMBIN TIME: 13 s (ref 11.4–15.2)

## 2016-01-01 LAB — COMPREHENSIVE METABOLIC PANEL
ALT: 13 U/L — AB (ref 17–63)
ANION GAP: 11 (ref 5–15)
AST: 25 U/L (ref 15–41)
Albumin: 3.2 g/dL — ABNORMAL LOW (ref 3.5–5.0)
Alkaline Phosphatase: 53 U/L (ref 38–126)
BUN: 61 mg/dL — ABNORMAL HIGH (ref 6–20)
CHLORIDE: 100 mmol/L — AB (ref 101–111)
CO2: 21 mmol/L — AB (ref 22–32)
CREATININE: 2.72 mg/dL — AB (ref 0.61–1.24)
Calcium: 9.2 mg/dL (ref 8.9–10.3)
GFR calc non Af Amer: 19 mL/min — ABNORMAL LOW (ref >60)
GFR, EST AFRICAN AMERICAN: 22 mL/min — AB (ref >60)
Glucose, Bld: 35 mg/dL — CL (ref 65–99)
Potassium: 5.1 mmol/L (ref 3.5–5.1)
SODIUM: 132 mmol/L — AB (ref 135–145)
Total Bilirubin: 0.5 mg/dL (ref 0.3–1.2)
Total Protein: 7.6 g/dL (ref 6.5–8.1)

## 2016-01-01 LAB — CBC
HCT: 30.8 % — ABNORMAL LOW (ref 39.0–52.0)
Hemoglobin: 10.4 g/dL — ABNORMAL LOW (ref 13.0–17.0)
MCH: 30.1 pg (ref 26.0–34.0)
MCHC: 33.8 g/dL (ref 30.0–36.0)
MCV: 89.3 fL (ref 78.0–100.0)
PLATELETS: 268 10*3/uL (ref 150–400)
RBC: 3.45 MIL/uL — ABNORMAL LOW (ref 4.22–5.81)
RDW: 15.1 % (ref 11.5–15.5)
WBC: 21.8 10*3/uL — AB (ref 4.0–10.5)

## 2016-01-01 LAB — I-STAT TROPONIN, ED: Troponin i, poc: 0.01 ng/mL (ref 0.00–0.08)

## 2016-01-01 LAB — CBG MONITORING, ED: GLUCOSE-CAPILLARY: 185 mg/dL — AB (ref 65–99)

## 2016-01-01 LAB — APTT: APTT: 41 s — AB (ref 24–36)

## 2016-01-01 MED ORDER — DEXTROSE 50 % IV SOLN
1.0000 | Freq: Once | INTRAVENOUS | Status: AC
  Administered 2016-01-01: 50 mL via INTRAVENOUS

## 2016-01-01 NOTE — ED Provider Notes (Signed)
[facility]-EMERGENCY DEPT Provider Note   CSN: [id number] Arrival date & time: 01/01/16  2206 Level V caveat acuity of situation. Seen on arrival. History is obtained from paramedics  An emergency department physician performed an initial assessment on this suspected stroke patient at 2214.  History   Chief Complaint Chief Complaint  Patient presents with  . Code Stroke    HPI Christian Dominguez is a 82 y.o. male.Patient noted to have slurred speech 9 PM tonight. Also with generalized weakness. EMS was called by family members. Patient was noted to have CBG of 59 by EMS. He was treated with supplemental oxygen. His nasal oxygen dosage was increased to 6 L from 2 L which she was at home. Further history from family members who arrived subsequently, patient has had diminished appetite for the past few days has had cough and generalized weakness. no fever.  HPI  Past Medical History:  Diagnosis Date  . AAA (abdominal aortic aneurysm) (HCC)   . Arthritis   . Calculus of gallbladder with cholecystitis   . Diabetes mellitus without complication (HCC)   . Esophageal reflux   . Hearing loss of aging   . Hyperlipidemia   . Thyroid disease     Patient Active Problem List   Diagnosis Date Noted  . Interstitial lung disease (HCC) 08/25/2014  . Pulmonary hypertension 08/25/2014  . Abdominal aortic aneurysm (AAA) >39 mm diameter (HCC)   . Status post percutaneous abdominal aortic aneurysm repair   . Abdominal aortic aneurysm without rupture (HCC) 07/16/2014  . Atrial tachycardia, paroxysmal (HCC) 07/16/2014    Past Surgical History:  Procedure Laterality Date  . ABDOMINAL AORTIC ENDOVASCULAR STENT GRAFT N/A 07/15/2014   Procedure: ABDOMINAL AORTIC ENDOVASCULAR STENT GRAFT;  Surgeon: Sherren Kerns, MD;  Location: [facility] OR;  Service: Vascular;  Laterality: N/A;  . CHOLECYSTECTOMY N/A 07/20/2014   Procedure: LAPAROSCOPIC CHOLECYSTECTOMY WITH INTRAOPERATIVE CHOLANGIOGRAM;  Surgeon: Violeta Gelinas, MD;   Location: [facility] OR;  Service: General;  Laterality: N/A;       Home Medications    Prior to Admission medications   Medication Sig Start Date End Date Taking? Authorizing Provider  acetaminophen (TYLENOL) 650 MG CR tablet Take 650 mg by mouth every 8 (eight) hours as needed for pain.   Yes Historical Provider, MD  amLODipine (NORVASC) 10 MG tablet Take 10 mg by mouth at bedtime.    Yes Historical Provider, MD  glimepiride (AMARYL) 1 MG tablet Take 1 mg by mouth daily with breakfast.  01/07/15  Yes Historical Provider, MD  levothyroxine (SYNTHROID, LEVOTHROID) 75 MCG tablet Take 75 mcg by mouth daily before breakfast.   Yes Historical Provider, MD  losartan-hydrochlorothiazide (HYZAAR) 100-12.5 MG per tablet Take 1 tablet by mouth daily.   Yes Historical Provider, MD  pravastatin (PRAVACHOL) 40 MG tablet Take 40 mg by mouth at bedtime.    Yes Historical Provider, MD  HYDROcodone-acetaminophen (NORCO/VICODIN) 5-325 MG per tablet Take 1-2 tablets by mouth every 4 (four) hours as needed for moderate pain or severe pain. Patient not taking: Reported on 01/01/2016 07/23/14   Raymond Gurney, PA-C    Family History Family History  Problem Relation Age of Onset  . Heart disease Father     Social History Social History  Substance Use Topics  . Smoking status: Never Smoker  . Smokeless tobacco: Not on file  . Alcohol use No     Allergies   Patient has no known allergies.   Review of Systems Review of Systems  Unable  to perform ROS: Acuity of condition  Allergic/Immunologic: Positive for immunocompromised state.       Diabetic     Physical Exam Updated Vital Signs BP 141/79 (BP Location: Right Arm)   Temp 97.8 F (36.6 C) (Oral)   Resp 26   Ht 5\' 6"  (1.676 m)   Wt 123 lb 14.4 oz (56.2 kg)   SpO2 94%   BMI 20.00 kg/m   Physical Exam  Constitutional:  Chronically and acutely ill-appearing  HENT:  Head: Normocephalic and atraumatic.  No facial asymmetry  Eyes:  Conjunctivae are normal. Pupils are equal, round, and reactive to light.  Neck: Neck supple. No tracheal deviation present. No thyromegaly present.  Cardiovascular: Normal rate and regular rhythm.   No murmur heard. Pulmonary/Chest: Effort normal. He has rales.  Diffuse rales  Abdominal: Soft. Bowel sounds are normal. He exhibits no distension. There is no tenderness.  Genitourinary: Penis normal.  Musculoskeletal: Normal range of motion. He exhibits no edema or tenderness.  Neurological: He is alert. Coordination normal.  Speech markedly slurred  Skin: Skin is warm and dry. No rash noted.  Psychiatric: He has a normal mood and affect.  Nursing note and vitals reviewed.    ED Treatments / Results  Labs (all labs ordered are listed, but only abnormal results are displayed) Labs Reviewed  APTT - Abnormal; Notable for the following:       Result Value   aPTT 41 (*)    All other components within normal limits  CBC - Abnormal; Notable for the following:    WBC 21.8 (*)    RBC 3.45 (*)    Hemoglobin 10.4 (*)    HCT 30.8 (*)    All other components within normal limits  DIFFERENTIAL - Abnormal; Notable for the following:    Neutro Abs 17.4 (*)    Monocytes Absolute 1.5 (*)    All other components within normal limits  CBG MONITORING, ED - Abnormal; Notable for the following:    Glucose-Capillary 185 (*)    All other components within normal limits  I-STAT CHEM 8, ED - Abnormal; Notable for the following:    Sodium 131 (*)    Chloride 99 (*)    BUN 64 (*)    Creatinine, Ser 2.80 (*)    Glucose, Bld 42 (*)    Calcium, Ion 1.12 (*)    Hemoglobin 10.5 (*)    HCT 31.0 (*)    All other components within normal limits  PROTIME-INR  COMPREHENSIVE METABOLIC PANEL  I-STAT TROPOININ, ED  I-STAT CG4 LACTIC ACID, ED    EKG  EKG Interpretation  Date/Time:  Sunday January 02 2016 00:35:56 EST Ventricular Rate:  91 PR Interval:    QRS Duration: 98 QT Interval:  352 QTC  Calculation: 433 R Axis:   -71 Text Interpretation:  Sinus rhythm Left axis deviation Low voltage, precordial leads Since last tracing rate slower Confirmed by Ethelda ChickJACUBOWITZ  MD, Lawrance Wiedemann [phone]([id number]) on 01/02/2016 12:39:23 AM      Chest x-ray viewed by me\ Results for orders placed or performed during the hospital encounter of 01/01/16  Protime-INR  Result Value Ref Range   Prothrombin Time 13.0 11.4 - 15.2 seconds   INR 0.98   APTT  Result Value Ref Range   aPTT 41 (H) 24 - 36 seconds  CBC  Result Value Ref Range   WBC 21.8 (H) 4.0 - 10.5 K/uL   RBC 3.45 (L) 4.22 - 5.81 MIL/uL   Hemoglobin 10.4 (L)  13.0 - 17.0 g/dL   HCT 02.7 (L) 25.3 - 66.4 %   MCV 89.3 78.0 - 100.0 fL   MCH 30.1 26.0 - 34.0 pg   MCHC 33.8 30.0 - 36.0 g/dL   RDW 40.3 47.4 - 25.9 %   Platelets 268 150 - 400 K/uL  Differential  Result Value Ref Range   Neutrophils Relative % 80 %   Neutro Abs 17.4 (H) 1.7 - 7.7 K/uL   Lymphocytes Relative 13 %   Lymphs Abs 2.8 0.7 - 4.0 K/uL   Monocytes Relative 7 %   Monocytes Absolute 1.5 (H) 0.1 - 1.0 K/uL   Eosinophils Relative 0 %   Eosinophils Absolute 0.1 0.0 - 0.7 K/uL   Basophils Relative 0 %   Basophils Absolute 0.0 0.0 - 0.1 K/uL  Comprehensive metabolic panel  Result Value Ref Range   Sodium 132 (L) 135 - 145 mmol/L   Potassium 5.1 3.5 - 5.1 mmol/L   Chloride 100 (L) 101 - 111 mmol/L   CO2 21 (L) 22 - 32 mmol/L   Glucose, Bld 35 (LL) 65 - 99 mg/dL   BUN 61 (H) 6 - 20 mg/dL   Creatinine, Ser 5.63 (H) 0.61 - 1.24 mg/dL   Calcium 9.2 8.9 - 87.5 mg/dL   Total Protein 7.6 6.5 - 8.1 g/dL   Albumin 3.2 (L) 3.5 - 5.0 g/dL   AST 25 15 - 41 U/L   ALT 13 (L) 17 - 63 U/L   Alkaline Phosphatase 53 38 - 126 U/L   Total Bilirubin 0.5 0.3 - 1.2 mg/dL   GFR calc non Af Amer 19 (L) >60 mL/min   GFR calc Af Amer 22 (L) >60 mL/min   Anion gap 11 5 - 15  I-stat troponin, ED  Result Value Ref Range   Troponin i, poc 0.01 0.00 - 0.08 ng/mL   Comment 3          CBG monitoring, ED   Result Value Ref Range   Glucose-Capillary 185 (H) 65 - 99 mg/dL   Comment 1 Notify RN   I-Stat Chem 8, ED  Result Value Ref Range   Sodium 131 (L) 135 - 145 mmol/L   Potassium 5.1 3.5 - 5.1 mmol/L   Chloride 99 (L) 101 - 111 mmol/L   BUN 64 (H) 6 - 20 mg/dL   Creatinine, Ser 6.43 (H) 0.61 - 1.24 mg/dL   Glucose, Bld 42 (LL) 65 - 99 mg/dL   Calcium, Ion 3.29 (L) 1.15 - 1.40 mmol/L   TCO2 23 0 - 100 mmol/L   Hemoglobin 10.5 (L) 13.0 - 17.0 g/dL   HCT 51.8 (L) 84.1 - 66.0 %   Comment NOTIFIED PHYSICIAN   I-Stat CG4 Lactic Acid, ED  Result Value Ref Range   Lactic Acid, Venous 1.11 0.5 - 1.9 mmol/L  I-Stat arterial blood gas, ED  Result Value Ref Range   pH, Arterial 7.313 (L) 7.350 - 7.450   pCO2 arterial 41.8 32.0 - 48.0 mmHg   pO2, Arterial 52.0 (L) 83.0 - 108.0 mmHg   Bicarbonate 21.2 20.0 - 28.0 mmol/L   TCO2 22 0 - 100 mmol/L   O2 Saturation 83.0 %   Acid-base deficit 5.0 (H) 0.0 - 2.0 mmol/L   Patient temperature 98.7 F    Collection site RADIAL, ALLEN'S TEST ACCEPTABLE    Drawn by RT    Sample type ARTERIAL    Dg Chest 2 View  Result Date: 01/02/2016 CLINICAL DATA:  Cough. EXAM:  CHEST  2 VIEW COMPARISON:  Radiographs and CT July 2016 FINDINGS: Coarse reticular opacities diffusely throughout both lungs have progressed from prior exam. No confluent airspace disease. Unchanged heart size and mediastinal contours. No pleural fluid. No pneumothorax. Cholecystectomy clips in the right upper quadrant. No acute osseous abnormality. IMPRESSION: Diffuse bilateral coarse reticular opacities, progressed from prior exam. This may be progressive interstitial lung disease versus superimposed pulmonary edema. No focal airspace disease to suggest pneumonia. Electronically Signed   By: Rubye Oaks M.D.   On: 01/02/2016 00:36   Ct Head Code Stroke W/o Cm  Result Date: 01/01/2016 CLINICAL DATA:  Code stroke. Slurred speech, difficulty walking ; last seen normal at 2100 hours. History  of diabetes. EXAM: CT HEAD WITHOUT CONTRAST TECHNIQUE: Contiguous axial images were obtained from the base of the skull through the vertex without intravenous contrast. COMPARISON:  None. FINDINGS: Mildly motion degraded examination. BRAIN: The ventricles and sulci are normal for age. No intraparenchymal hemorrhage, mass effect nor midline shift. Patchy supratentorial white matter hypodensities within normal range for patient's age, though non-specific are most compatible with chronic small vessel ischemic disease. No acute large vascular territory infarcts. No abnormal extra-axial fluid collections. Basal cisterns are patent. VASCULAR: Moderate calcific atherosclerosis of the carotid siphons. SKULL: No skull fracture. No significant scalp soft tissue swelling. SINUSES/ORBITS: Soft tissue within LEFT middle ear in mastoid air cells with air cell coalescence and, probable bony re- absorption of the ossicles. Mild paranasal sinus mucosal thickening with minimal air-fluid levels. The included ocular globes and orbital contents are non-suspicious. OTHER: None. ASPECTS [facility] Stroke Program Early CT Score) - Ganglionic level infarction (caudate, lentiform nuclei, internal capsule, insula, M1-M3 cortex): 7 - Supraganglionic infarction (M4-M6 cortex): 3 Total score (0-10 with 10 being normal): 10 IMPRESSION: 1. No acute intracranial process ; negative CT HEAD for age. Chronic LEFT otomastoiditis. 2. ASPECTS is 10. Critical Value/emergent results were called by telephone at the time of interpretation on 01/01/2016 at 10:35 pm to Dr. Roseanne Reno, Neurology , who verbally acknowledged these results. Electronically Signed   By: Awilda Metro M.D.   On: 01/01/2016 22:37   Radiology Ct Head Code Stroke W/o Cm  Result Date: 01/01/2016 CLINICAL DATA:  Code stroke. Slurred speech, difficulty walking ; last seen normal at 2100 hours. History of diabetes. EXAM: CT HEAD WITHOUT CONTRAST TECHNIQUE: Contiguous axial images were  obtained from the base of the skull through the vertex without intravenous contrast. COMPARISON:  None. FINDINGS: Mildly motion degraded examination. BRAIN: The ventricles and sulci are normal for age. No intraparenchymal hemorrhage, mass effect nor midline shift. Patchy supratentorial white matter hypodensities within normal range for patient's age, though non-specific are most compatible with chronic small vessel ischemic disease. No acute large vascular territory infarcts. No abnormal extra-axial fluid collections. Basal cisterns are patent. VASCULAR: Moderate calcific atherosclerosis of the carotid siphons. SKULL: No skull fracture. No significant scalp soft tissue swelling. SINUSES/ORBITS: Soft tissue within LEFT middle ear in mastoid air cells with air cell coalescence and, probable bony re- absorption of the ossicles. Mild paranasal sinus mucosal thickening with minimal air-fluid levels. The included ocular globes and orbital contents are non-suspicious. OTHER: None. ASPECTS [facility] Stroke Program Early CT Score) - Ganglionic level infarction (caudate, lentiform nuclei, internal capsule, insula, M1-M3 cortex): 7 - Supraganglionic infarction (M4-M6 cortex): 3 Total score (0-10 with 10 being normal): 10 IMPRESSION: 1. No acute intracranial process ; negative CT HEAD for age. Chronic LEFT otomastoiditis. 2. ASPECTS is 10. Critical Value/emergent results were  called by telephone at the time of interpretation on 01/01/2016 at 10:35 pm to Dr. Roseanne Reno, Neurology , who verbally acknowledged these results. Electronically Signed   By: Awilda Metro M.D.   On: 01/01/2016 22:37    Procedures Procedures (including critical care time)  Medications Ordered in ED Medications  dextrose 50 % solution 50 mL (50 mLs Intravenous Given 01/01/16 2211)     Initial Impression / Assessment and Plan / ED Course  I have reviewed the triage vital signs and the nursing notes.  Pertinent labs & imaging results that were  available during my care of the patient were reviewed by me and considered in my medical decision making (see chart for details).  Clinical Course   Patient received D50 1 amp intravenously immediately upon arrival in the ED. Speech became clear within 2-3 minutes. Code stroke was canceled by me  Chest x-ray viewed by me. 12:30 AM patient is alert remains comfortable. Blood gas reviewed. Oxygen increased to 3 L nasal cannula. He was given applesauce to eat. I've consulted hospitalist physician who will see patient in ED and arrange for overnight stay and I suspect the patient has influenza-like illness leading to diminished appetite and hypoglycemia. Also has acute kidney. Anemia is chronic While hospitalized blood sugars will need to be monitored. He may require IV hydration if he does not eat adequate Final Clinical Impressions(s) / ED Diagnoses  Diagnosis #1 hypoglycemia #2 acute kidney injury #3 influenza-like illness #4 anemia Final diagnoses:  None    New Prescriptions New Prescriptions   No medications on file     Doug Sou, MD 01/02/16 0101

## 2016-01-01 NOTE — ED Triage Notes (Signed)
Pt to ED via [facility] with c/o Code Stroke.  Family reported to EMS that pt was normal then went to take shower shortly after pt had slurred speech and was not acting right.  On arrival to ED pt leaning to left, speech slurred.  CBG by EMS was 59.   1 amp D50 given when pt arrived to ED and pt became alert and oriented.  Speech clear.

## 2016-01-01 NOTE — ED Notes (Signed)
Pt sats noted to be 77%. Pt comfortable in bed, on 2L. Pt reports always being short of breath and doesn't feel more sob than baseline. Sats improving with deep inspiration

## 2016-01-02 ENCOUNTER — Emergency Department (HOSPITAL_COMMUNITY): Payer: Medicare Other

## 2016-01-02 ENCOUNTER — Encounter (HOSPITAL_COMMUNITY): Payer: Self-pay | Admitting: Internal Medicine

## 2016-01-02 DIAGNOSIS — R05 Cough: Secondary | ICD-10-CM | POA: Diagnosis not present

## 2016-01-02 DIAGNOSIS — E162 Hypoglycemia, unspecified: Secondary | ICD-10-CM | POA: Diagnosis not present

## 2016-01-02 DIAGNOSIS — J849 Interstitial pulmonary disease, unspecified: Secondary | ICD-10-CM | POA: Diagnosis present

## 2016-01-02 DIAGNOSIS — J9621 Acute and chronic respiratory failure with hypoxia: Secondary | ICD-10-CM | POA: Diagnosis present

## 2016-01-02 DIAGNOSIS — N184 Chronic kidney disease, stage 4 (severe): Secondary | ICD-10-CM | POA: Diagnosis present

## 2016-01-02 DIAGNOSIS — J449 Chronic obstructive pulmonary disease, unspecified: Secondary | ICD-10-CM | POA: Diagnosis not present

## 2016-01-02 DIAGNOSIS — D6489 Other specified anemias: Secondary | ICD-10-CM | POA: Diagnosis present

## 2016-01-02 DIAGNOSIS — Z95828 Presence of other vascular implants and grafts: Secondary | ICD-10-CM | POA: Diagnosis not present

## 2016-01-02 DIAGNOSIS — E039 Hypothyroidism, unspecified: Secondary | ICD-10-CM | POA: Diagnosis present

## 2016-01-02 DIAGNOSIS — Z9981 Dependence on supplemental oxygen: Secondary | ICD-10-CM | POA: Diagnosis not present

## 2016-01-02 DIAGNOSIS — E11649 Type 2 diabetes mellitus with hypoglycemia without coma: Secondary | ICD-10-CM | POA: Diagnosis present

## 2016-01-02 DIAGNOSIS — Z79891 Long term (current) use of opiate analgesic: Secondary | ICD-10-CM | POA: Diagnosis not present

## 2016-01-02 DIAGNOSIS — J069 Acute upper respiratory infection, unspecified: Secondary | ICD-10-CM | POA: Diagnosis present

## 2016-01-02 DIAGNOSIS — Z66 Do not resuscitate: Secondary | ICD-10-CM | POA: Diagnosis present

## 2016-01-02 DIAGNOSIS — K210 Gastro-esophageal reflux disease with esophagitis: Secondary | ICD-10-CM | POA: Diagnosis not present

## 2016-01-02 DIAGNOSIS — I749 Embolism and thrombosis of unspecified artery: Secondary | ICD-10-CM | POA: Diagnosis not present

## 2016-01-02 DIAGNOSIS — R4781 Slurred speech: Secondary | ICD-10-CM | POA: Diagnosis present

## 2016-01-02 DIAGNOSIS — Z7984 Long term (current) use of oral hypoglycemic drugs: Secondary | ICD-10-CM | POA: Diagnosis not present

## 2016-01-02 DIAGNOSIS — E785 Hyperlipidemia, unspecified: Secondary | ICD-10-CM | POA: Diagnosis present

## 2016-01-02 DIAGNOSIS — E860 Dehydration: Secondary | ICD-10-CM | POA: Diagnosis present

## 2016-01-02 DIAGNOSIS — R0600 Dyspnea, unspecified: Secondary | ICD-10-CM | POA: Diagnosis not present

## 2016-01-02 DIAGNOSIS — Z77090 Contact with and (suspected) exposure to asbestos: Secondary | ICD-10-CM | POA: Diagnosis present

## 2016-01-02 DIAGNOSIS — E1122 Type 2 diabetes mellitus with diabetic chronic kidney disease: Secondary | ICD-10-CM | POA: Diagnosis present

## 2016-01-02 DIAGNOSIS — N179 Acute kidney failure, unspecified: Secondary | ICD-10-CM | POA: Diagnosis present

## 2016-01-02 DIAGNOSIS — I129 Hypertensive chronic kidney disease with stage 1 through stage 4 chronic kidney disease, or unspecified chronic kidney disease: Secondary | ICD-10-CM | POA: Diagnosis present

## 2016-01-02 DIAGNOSIS — K219 Gastro-esophageal reflux disease without esophagitis: Secondary | ICD-10-CM | POA: Diagnosis present

## 2016-01-02 DIAGNOSIS — E119 Type 2 diabetes mellitus without complications: Secondary | ICD-10-CM | POA: Diagnosis not present

## 2016-01-02 DIAGNOSIS — J439 Emphysema, unspecified: Secondary | ICD-10-CM | POA: Diagnosis present

## 2016-01-02 DIAGNOSIS — I272 Pulmonary hypertension, unspecified: Secondary | ICD-10-CM | POA: Diagnosis present

## 2016-01-02 DIAGNOSIS — H9190 Unspecified hearing loss, unspecified ear: Secondary | ICD-10-CM | POA: Diagnosis present

## 2016-01-02 DIAGNOSIS — Z79899 Other long term (current) drug therapy: Secondary | ICD-10-CM | POA: Diagnosis not present

## 2016-01-02 LAB — I-STAT ARTERIAL BLOOD GAS, ED
Acid-base deficit: 5 mmol/L — ABNORMAL HIGH (ref 0.0–2.0)
Bicarbonate: 21.2 mmol/L (ref 20.0–28.0)
O2 SAT: 83 %
TCO2: 22 mmol/L (ref 0–100)
pCO2 arterial: 41.8 mmHg (ref 32.0–48.0)
pH, Arterial: 7.313 — ABNORMAL LOW (ref 7.350–7.450)
pO2, Arterial: 52 mmHg — ABNORMAL LOW (ref 83.0–108.0)

## 2016-01-02 LAB — GLUCOSE, CAPILLARY
GLUCOSE-CAPILLARY: 139 mg/dL — AB (ref 65–99)
GLUCOSE-CAPILLARY: 60 mg/dL — AB (ref 65–99)
GLUCOSE-CAPILLARY: 172 mg/dL — AB (ref 65–99)
Glucose-Capillary: 74 mg/dL (ref 65–99)
Glucose-Capillary: 153 mg/dL — ABNORMAL HIGH (ref 65–99)
Glucose-Capillary: 143 mg/dL — ABNORMAL HIGH (ref 65–99)

## 2016-01-02 LAB — URINALYSIS, ROUTINE W REFLEX MICROSCOPIC
Bilirubin Urine: NEGATIVE
Glucose, UA: NEGATIVE mg/dL
Ketones, ur: NEGATIVE mg/dL
Leukocytes, UA: NEGATIVE
Nitrite: NEGATIVE
PH: 5 (ref 5.0–8.0)
Protein, ur: NEGATIVE mg/dL
SPECIFIC GRAVITY, URINE: 1.008 (ref 1.005–1.030)

## 2016-01-02 LAB — CBG MONITORING, ED: GLUCOSE-CAPILLARY: 136 mg/dL — AB (ref 65–99)

## 2016-01-02 LAB — CBC
HCT: 28.6 % — ABNORMAL LOW (ref 39.0–52.0)
Hemoglobin: 9.3 g/dL — ABNORMAL LOW (ref 13.0–17.0)
MCH: 28.9 pg (ref 26.0–34.0)
MCHC: 32.5 g/dL (ref 30.0–36.0)
MCV: 88.8 fL (ref 78.0–100.0)
Platelets: 265 10*3/uL (ref 150–400)
RBC: 3.22 MIL/uL — AB (ref 4.22–5.81)
RDW: 14.9 % (ref 11.5–15.5)
WBC: 15.5 10*3/uL — AB (ref 4.0–10.5)

## 2016-01-02 LAB — INFLUENZA PANEL BY PCR (TYPE A & B)
INFLBPCR: NEGATIVE
Influenza A By PCR: NEGATIVE

## 2016-01-02 LAB — COMPREHENSIVE METABOLIC PANEL
ALT: 12 U/L — ABNORMAL LOW (ref 17–63)
ANION GAP: 3 — AB (ref 5–15)
AST: 23 U/L (ref 15–41)
Albumin: 2.5 g/dL — ABNORMAL LOW (ref 3.5–5.0)
Alkaline Phosphatase: 49 U/L (ref 38–126)
BUN: 55 mg/dL — ABNORMAL HIGH (ref 6–20)
CALCIUM: 8.6 mg/dL — AB (ref 8.9–10.3)
CHLORIDE: 101 mmol/L (ref 101–111)
CO2: 28 mmol/L (ref 22–32)
Creatinine, Ser: 2.57 mg/dL — ABNORMAL HIGH (ref 0.61–1.24)
GFR calc non Af Amer: 20 mL/min — ABNORMAL LOW (ref >60)
GFR, EST AFRICAN AMERICAN: 23 mL/min — AB (ref >60)
Glucose, Bld: 83 mg/dL (ref 65–99)
Potassium: 5 mmol/L (ref 3.5–5.1)
SODIUM: 132 mmol/L — AB (ref 135–145)
Total Bilirubin: 0.3 mg/dL (ref 0.3–1.2)
Total Protein: 6.3 g/dL — ABNORMAL LOW (ref 6.5–8.1)

## 2016-01-02 LAB — TSH: TSH: 1.627 u[IU]/mL (ref 0.350–4.500)

## 2016-01-02 MED ORDER — IPRATROPIUM-ALBUTEROL 0.5-2.5 (3) MG/3ML IN SOLN: 3.0000 mL | Freq: Four times a day (QID) | RESPIRATORY_TRACT | Status: DC

## 2016-01-02 MED ORDER — PRAVASTATIN SODIUM 40 MG PO TABS
40.0000 mg | ORAL_TABLET | Freq: Every day | ORAL | Status: DC
  Administered 2016-01-02 – 2016-01-04 (×3): 40 mg via ORAL
  Filled 2016-01-02 (×3): qty 1

## 2016-01-02 MED ORDER — ONDANSETRON HCL 4 MG PO TABS
4.0000 mg | ORAL_TABLET | Freq: Four times a day (QID) | ORAL | Status: DC | PRN
  Administered 2016-01-03: 4 mg via ORAL
  Filled 2016-01-02: qty 1

## 2016-01-02 MED ORDER — OSELTAMIVIR PHOSPHATE 30 MG PO CAPS
30.0000 mg | ORAL_CAPSULE | Freq: Every day | ORAL | Status: DC
  Filled 2016-01-02: qty 1

## 2016-01-02 MED ORDER — ONDANSETRON HCL 4 MG/2ML IJ SOLN: 4.0000 mg | Freq: Four times a day (QID) | INTRAMUSCULAR | Status: DC | PRN

## 2016-01-02 MED ORDER — LEVOFLOXACIN IN D5W 500 MG/100ML IV SOLN
500.0000 mg | INTRAVENOUS | Status: DC
  Administered 2016-01-02 – 2016-01-04 (×2): 500 mg via INTRAVENOUS
  Filled 2016-01-02 (×2): qty 100

## 2016-01-02 MED ORDER — AMLODIPINE BESYLATE 10 MG PO TABS
10.0000 mg | ORAL_TABLET | Freq: Every day | ORAL | Status: DC
  Administered 2016-01-03 – 2016-01-04 (×2): 10 mg via ORAL
  Filled 2016-01-02 (×3): qty 1

## 2016-01-02 MED ORDER — HEPARIN SODIUM (PORCINE) 5000 UNIT/ML IJ SOLN
5000.0000 [IU] | Freq: Three times a day (TID) | INTRAMUSCULAR | Status: DC
  Administered 2016-01-02 – 2016-01-03 (×3): 5000 [IU] via SUBCUTANEOUS
  Filled 2016-01-02 (×5): qty 1

## 2016-01-02 MED ORDER — IPRATROPIUM-ALBUTEROL 0.5-2.5 (3) MG/3ML IN SOLN: 3.0000 mL | RESPIRATORY_TRACT | Status: DC | PRN

## 2016-01-02 MED ORDER — OSELTAMIVIR PHOSPHATE 30 MG PO CAPS
30.0000 mg | ORAL_CAPSULE | Freq: Two times a day (BID) | ORAL | Status: DC
  Filled 2016-01-02: qty 1

## 2016-01-02 MED ORDER — ACETAMINOPHEN 325 MG PO TABS
650.0000 mg | ORAL_TABLET | Freq: Four times a day (QID) | ORAL | Status: DC | PRN
  Administered 2016-01-03 – 2016-01-04 (×4): 650 mg via ORAL
  Filled 2016-01-02 (×4): qty 2

## 2016-01-02 MED ORDER — LEVOTHYROXINE SODIUM 75 MCG PO TABS
75.0000 ug | ORAL_TABLET | Freq: Every day | ORAL | Status: DC
  Administered 2016-01-02 – 2016-01-05 (×4): 75 ug via ORAL
  Filled 2016-01-02 (×4): qty 1

## 2016-01-02 MED ORDER — ACETAMINOPHEN 650 MG RE SUPP: 650.0000 mg | Freq: Four times a day (QID) | RECTAL | Status: DC | PRN

## 2016-01-02 NOTE — H&P (Signed)
History and Physical    Christian Dominguez UEA:[id number] DOB: 05/29/1921 DOA: 01/01/2016  PCP: No PCP Per Patient  Patient coming from: home  Chief Complaint: slurring speech  HPI: Christian RiggerJack Pring is a 82 y.o. male with medical history significant of DM/CKD/AAA repair presents with slurred speech.  Pt was concerned he is having a stroke.   CBG was done and it was 35 followed by 42.  After administration of D50 the symptoms improved.  Stroke pathway was cancelled, HCT was done which did not demonstrate any stroke.  Pt has Emphysema 2/2 asbestos exposure, he is on 2 L O2 at home.  This requirement increased while in the ER to 6L O2.  He has had URI symptoms for the last couple days.  Last Sunday he went to church and may have been exposed to some people who were sick.  Not sure of any sick contacts at home.  No recent hospital admissions or being in a facility.   + cough, sob, weakness, decrease appetite, poor intake. He denies any chest pain, palpitations, hemoptysis, nausea, vomiting, fever, chills, abdominal pain, dysuria, hematuria, polyuria, nocturia, BRBPR, melena, hematochezia, heat/cold intolerance, orthopnea, PND, or leg swelling   ED Course: Given D50, HCT negative for stroke  Review of Systems: As per HPI otherwise 10 point review of systems negative.   Past Medical History:  Diagnosis Date  . AAA (abdominal aortic aneurysm) (HCC)   . Arthritis   . Calculus of gallbladder with cholecystitis   . COPD (chronic obstructive pulmonary disease) (HCC)   . Diabetes mellitus without complication (HCC)   . Esophageal reflux   . Hearing loss of aging   . Hyperlipidemia   . Thyroid disease     Past Surgical History:  Procedure Laterality Date  . ABDOMINAL AORTIC ENDOVASCULAR STENT GRAFT N/A 07/15/2014   Procedure: ABDOMINAL AORTIC ENDOVASCULAR STENT GRAFT;  Surgeon: Sherren Kernsharles E Fields, MD;  [facility];  Service: Vascular;  Laterality: N/A;  . CHOLECYSTECTOMY N/A 07/20/2014   Procedure:  LAPAROSCOPIC CHOLECYSTECTOMY WITH INTRAOPERATIVE CHOLANGIOGRAM;  Surgeon: Violeta GelinasBurke Thompson, MD;  Location: [facility] OR;  Service: General;  Laterality: N/A;     reports that he has never smoked. He has never used smokeless tobacco. He reports that he does not drink alcohol or use drugs.  No Known Allergies  Family History  Problem Relation Age of Onset  . Heart disease Father    Family hx reviewed  Prior to Admission medications   Medication Sig Start Date End Date Taking? Authorizing Provider  acetaminophen (TYLENOL) 650 MG CR tablet Take 650 mg by mouth every 8 (eight) hours as needed for pain.   Yes Historical Provider, MD  amLODipine (NORVASC) 10 MG tablet Take 10 mg by mouth at bedtime.    Yes Historical Provider, MD  glimepiride (AMARYL) 1 MG tablet Take 1 mg by mouth daily with breakfast.  01/07/15  Yes Historical Provider, MD  levothyroxine (SYNTHROID, LEVOTHROID) 75 MCG tablet Take 75 mcg by mouth daily before breakfast.   Yes Historical Provider, MD  losartan-hydrochlorothiazide (HYZAAR) 100-12.5 MG per tablet Take 1 tablet by mouth daily.   Yes Historical Provider, MD  pravastatin (PRAVACHOL) 40 MG tablet Take 40 mg by mouth at bedtime.    Yes Historical Provider, MD  HYDROcodone-acetaminophen (NORCO/VICODIN) 5-325 MG per tablet Take 1-2 tablets by mouth every 4 (four) hours as needed for moderate pain or severe pain. Patient not taking: Reported on 01/01/2016 07/23/14   Raymond GurneyKimberly A Trinh, PA-C    Physical  Exam: Vitals:   01/02/16 0000 01/02/16 0040 01/02/16 0045 01/02/16 0115  BP: 121/58  124/85 134/64  Pulse: 93  101 98  Resp: (!) 29  17 (!) 27  Temp:  97.9 F (36.6 C)    TempSrc:      SpO2: 94%  90% 91%  Weight:      Height:          Constitutional: NAD, calm, comfortable Vitals:   01/02/16 0000 01/02/16 0040 01/02/16 0045 01/02/16 0115  BP: 121/58  124/85 134/64  Pulse: 93  101 98  Resp: (!) 29  17 (!) 27  Temp:  97.9 F (36.6 C)    TempSrc:      SpO2: 94%  90%  91%  Weight:      Height:       Eyes: PERRL, lids and conjunctivae normal, anicteric ENMT: Mucous membranes are moist. Posterior pharynx clear of any exudate or lesions.Normal dentition.  Neck: normal, supple, no masses, no thyromegaly Respiratory: no wheezing, + dry crackles b/l towards the bases. Normal respiratory effort. No accessory muscle use.  Cardiovascular: Regular rate and rhythm, no murmurs / rubs / gallops. No extremity edema. 2+ pedal pulses. No carotid bruits.  Abdomen: no tenderness, no masses palpated. No hepatosplenomegaly. Bowel sounds positive.  Musculoskeletal: no clubbing / cyanosis. No joint deformity upper and lower extremities. Good ROM, no contractures. Normal muscle tone.  Skin: no rashes, lesions, ulcers. No induration Neurologic: CN 2-12 grossly intact. Sensation intact, DTR normal. Strength 5/5 in all 4.  Psychiatric: Normal judgment and insight. Alert and oriented x 3. Normal mood.  Capillary refill less than 2 seconds   Labs on Admission: I have personally reviewed following labs and imaging studies  CBC:  Recent Labs Lab 01/01/16 2211 01/01/16 2218  WBC 21.8*  --   NEUTROABS 17.4*  --   HGB 10.4* 10.5*  HCT 30.8* 31.0*  MCV 89.3  --   PLT 268  --    Basic Metabolic Panel:  Recent Labs Lab 01/01/16 2211 01/01/16 2218  NA 132* 131*  K 5.1 5.1  CL 100* 99*  CO2 21*  --   GLUCOSE 35* 42*  BUN 61* 64*  CREATININE 2.72* 2.80*  CALCIUM 9.2  --    GFR: Estimated Creatinine Clearance: 13.4 mL/min (by C-G formula based on SCr of 2.8 mg/dL (H)). Liver Function Tests:  Recent Labs Lab 01/01/16 2211  AST 25  ALT 13*  ALKPHOS 53  BILITOT 0.5  PROT 7.6  ALBUMIN 3.2*   No results for input(s): LIPASE, AMYLASE in the last 168 hours. No results for input(s): AMMONIA in the last 168 hours. Coagulation Profile:  Recent Labs Lab 01/01/16 2211  INR 0.98   Cardiac Enzymes: No results for input(s): CKTOTAL, CKMB, CKMBINDEX, TROPONINI in  the last 168 hours. BNP (last 3 results) No results for input(s): PROBNP in the last 8760 hours. HbA1C: No results for input(s): HGBA1C in the last 72 hours. CBG:  Recent Labs Lab 01/01/16 2226 01/02/16 0102  GLUCAP 185* 136*   Lipid Profile: No results for input(s): CHOL, HDL, LDLCALC, TRIG, CHOLHDL, LDLDIRECT in the last 72 hours. Thyroid Function Tests: No results for input(s): TSH, T4TOTAL, FREET4, T3FREE, THYROIDAB in the last 72 hours. Anemia Panel: No results for input(s): VITAMINB12, FOLATE, FERRITIN, TIBC, IRON, RETICCTPCT in the last 72 hours. Urine analysis:    Component Value Date/Time   COLORURINE YELLOW 07/16/2014 0928   APPEARANCEUR CLOUDY (A) 07/16/2014 0928   LABSPEC 1.031 (  H) 07/16/2014 0928   PHURINE 5.0 07/16/2014 0928   GLUCOSEU NEGATIVE 07/16/2014 0928   HGBUR LARGE (A) 07/16/2014 0928   BILIRUBINUR NEGATIVE 07/16/2014 0928   KETONESUR NEGATIVE 07/16/2014 0928   PROTEINUR NEGATIVE 07/16/2014 0928   UROBILINOGEN 1.0 07/16/2014 0928   NITRITE NEGATIVE 07/16/2014 0928   LEUKOCYTESUR SMALL (A) 07/16/2014 0928    Sepsis Labs:  No results found for this or any previous visit (from the past 240 hour(s)).   Radiological Exams on Admission: Dg Chest 2 View  Result Date: 01/02/2016 CLINICAL DATA:  Cough. EXAM: CHEST  2 VIEW COMPARISON:  Radiographs and CT July 2016 FINDINGS: Coarse reticular opacities diffusely throughout both lungs have progressed from prior exam. No confluent airspace disease. Unchanged heart size and mediastinal contours. No pleural fluid. No pneumothorax. Cholecystectomy clips in the right upper quadrant. No acute osseous abnormality. IMPRESSION: Diffuse bilateral coarse reticular opacities, progressed from prior exam. This may be progressive interstitial lung disease versus superimposed pulmonary edema. No focal airspace disease to suggest pneumonia. Electronically Signed   By: Rubye Oaks M.D.   On: 01/02/2016 00:36   Ct Head Code  Stroke W/o Cm  Result Date: 01/01/2016 CLINICAL DATA:  Code stroke. Slurred speech, difficulty walking ; last seen normal at 2100 hours. History of diabetes. EXAM: CT HEAD WITHOUT CONTRAST TECHNIQUE: Contiguous axial images were obtained from the base of the skull through the vertex without intravenous contrast. COMPARISON:  None. FINDINGS: Mildly motion degraded examination. BRAIN: The ventricles and sulci are normal for age. No intraparenchymal hemorrhage, mass effect nor midline shift. Patchy supratentorial white matter hypodensities within normal range for patient's age, though non-specific are most compatible with chronic small vessel ischemic disease. No acute large vascular territory infarcts. No abnormal extra-axial fluid collections. Basal cisterns are patent. VASCULAR: Moderate calcific atherosclerosis of the carotid siphons. SKULL: No skull fracture. No significant scalp soft tissue swelling. SINUSES/ORBITS: Soft tissue within LEFT middle ear in mastoid air cells with air cell coalescence and, probable bony re- absorption of the ossicles. Mild paranasal sinus mucosal thickening with minimal air-fluid levels. The included ocular globes and orbital contents are non-suspicious. OTHER: None. ASPECTS [employer] Stroke Program Early CT Score) - Ganglionic level infarction (caudate, lentiform nuclei, internal capsule, insula, M1-M3 cortex): 7 - Supraganglionic infarction (M4-M6 cortex): 3 Total score (0-10 with 10 being normal): 10 IMPRESSION: 1. No acute intracranial process ; negative CT HEAD for age. Chronic LEFT otomastoiditis. 2. ASPECTS is 10. Critical Value/emergent results were called by telephone at the time of interpretation on 01/01/2016 at 10:35 pm to Dr. Roseanne Reno, Neurology , who verbally acknowledged these results. Electronically Signed   By: Awilda Metro M.D.   On: 01/01/2016 22:37    EKG: by my independent review. NSR 91, LAD, low voltage, PRWP  Assessment/Plan Principal Problem:    Hypoglycemia Active Problems:   Interstitial lung disease (HCC)   Pulmonary hypertension   URI with cough and congestion     Hypoglycemia: SSI coverage, ck labs, monitor. Appears to be improving, likely home today if stable  Emphysema and URI: supportive care, nebs prn, O2 support, monitor  AAA repair: stable, cont meds monitor  HTN: continue meds, monitor, optimize care and Rx.  Given creat will stop HCTZ  Hypothyroidism: monitor, ck labs, continue med  DVT prophylaxis: HSQ  Code Status: DNR  Family Communication: daughter and grand dtr(PA at [facility])  Disposition Plan: home  Consults called: none  Admission status: OBS    Kerrin Mo MD [facility] Pager  [phone]  If 7PM-7AM, please contact night-coverage [email or web address] Password [email or web address]  01/02/2016, 1:30 AM

## 2016-01-02 NOTE — Progress Notes (Addendum)
Hypoglycemic Event  CBG: 60  Treatment:12oz of OJ Symptoms: none: routine check  Follow-up CBG: Time:6:40 CBG Result: 74  Possible Reasons for Event:unknown Comments/MD notified:yes on call Dr. Ileene Musa, Lenyx Boody L

## 2016-01-02 NOTE — Progress Notes (Signed)
Patient seen and examined this morning, admitted overnight by Dr. Dorma Russell, review the assessment and plan  Christian Dominguez is a 82 y.o. male with medical history significant of DM/CKD/AAA repair presents with slurred speech.  Pt was concerned he is having a stroke.   CBG was done and it was 35 followed by 42.  After administration of D50 the symptoms improved.  Stroke pathway was cancelled, HCT was done which did not demonstrate any stroke.  Pt has Emphysema 2/2 asbestos exposure, he is on 2 L O2 at home.  This requirement increased while in the ER to 6L O2.  He has had URI symptoms for the last couple days.  Last Sunday he went to church and may have been exposed to some people who were sick.  Not sure of any sick contacts at home.  No recent hospital admissions or being in a facility.     Hypoglycemia  - likely in setting of poor by mouth intake and infectious process with upper respiratory type symptoms - Resolved this morning  URI - On underlying emphysema and chronic pulmonary disease in the setting of asbestos exposure - Patient with productive cough, we'll start empirically Levaquin - He is negative for influenza  Acute on chronic hypoxic respiratory failure - Patient on 2 L nasal cannula at home, desatting significantly with physical therapy today, dropped to 64% on 3 L, goal sats 88% and above, oxygen as needed increased to 6 L  Acute on chronic kidney disease stage IV - Creatinine has been recently in the 1.6 range, however it was as high as 2.5 to mid threes in the past, creatinine on presentation 2.8, likely due to dehydration, improved to 2.5 this morning, continue fluids, continue to monitor  Hypertension - Blood pressure controlled, continue Norvasc, and hold losartan hydrochlorothiazide given renal failure   Hypothyroidism - Continue Synthroid  Hyperlipidemia - On statin   Costin M. Elvera Lennox, MD [facility] [phone]

## 2016-01-02 NOTE — Significant Event (Signed)
Rapid Response Event Note  Called by Luan Pulling, RN with report of pt O2 sats 83% on 4l [place]  Overview: Time Called: 2129 Arrival Time: 2132 Event Type: Respiratory  Initial Focused Assessment:  On arrival to room pt sitting up in bed, having just been repositioned by staff.  Daughter at bedside states pt always has increasing SOB with positioning and it takes him a while to normalize.  RR 38 with O2 sats 86 on 4l [place].  Bilateral breath sounds with dry crackles in  Upper fields and diminished in bilateral bases. Tachypnic but no use of accessory muscles. Pt alert, oriented times 4, denies chest pain, states he is always SOB due to his pulmonary disease.   Interventions: Temporarily place pt on [facility] for approximately 6 minutes when sats increased to 100% and RR back to 20s. Placed back on [place] 4L with O2 sats 92%  Plan of Care (if not transferred): Continues to closely monitor respiratory status, continue continuous pulse OX,  Allow pt time to recoup after exertion,  call as needed for further assistance.  Event Summary:   at      at    Outcome: Stayed in room and stabalized  Timed ended: 2210  Meryl Hubers, [place]

## 2016-01-02 NOTE — Evaluation (Signed)
Physical Therapy Evaluation Patient Details Name: Christian Dominguez MRN: [id number] DOB: 12-19-1921 Today's Date: 01/02/2016   History of Present Illness  Patient is a 82 y/o male with hx of HLD, hearing loss, COPD, thyroid disease and AAA presents with slurred speech. CBG was done and it was 35 followed by 42. Pt with Emphysema 2/2 asbestos exposure, on home 02.  Clinical Impression  Patient presents with generalized weakness, dyspnea on exertion, impaired balance and decreased activity tolerance s/p above. Pt's Sp02 dropped to 64% on 3L/min 02 and the wave form was accurate. It took >5-6 minutes to return to high 80s with cues for pursed lip breathing. Pt tolerated gait training and required Min A initially progressing to mod A towards end of bout due to increased gait speed and bil knees giving out. Pt lives with 52 y/o wife and not sure how much assist she can provide. Education on activity modification at home due to SOB. Pt with hx of falls per family but they report he is going home. Will follow acutely to maximize independence and mobility prior to return home.     Follow Up Recommendations Home health PT;Supervision for mobility/OOB;Supervision/Assistance - 24 hour    Equipment Recommendations  None recommended by PT    Recommendations for Other Services OT consult     Precautions / Restrictions Precautions Precautions: Fall Precaution Comments: watch 02 Restrictions Weight Bearing Restrictions: No      Mobility  Bed Mobility Overal bed mobility: Needs Assistance Bed Mobility: Supine to Sit     Supine to sit: Min guard;HOB elevated     General bed mobility comments: Increased time and effort, use of rail to get to EOB. DOE.  Transfers Overall transfer level: Needs assistance Equipment used: Rolling walker (2 wheeled) Transfers: Sit to/from Stand Sit to Stand: Min guard         General transfer comment: Min guard for safety. Stood from [employer]. Transferred to chair  post ambulation.   Ambulation/Gait Ambulation/Gait assistance: Min assist;Mod assist Ambulation Distance (Feet): 150 Feet Assistive device: Rolling walker (2 wheeled) Gait Pattern/deviations: Step-through pattern;Decreased stride length;Trunk flexed Gait velocity: increased and unsafe towards end of ambulation   General Gait Details: Pt 4/4 DOE- not able to talk while ambulating. Pt with forward flexed trunk worsening with increased distance as well as increased knee flexion- manual cues for RW proximity. Sp02 dropped t 64% on 3L/min 02. Took 5 minutes for Sp02 to get into the 80s. Cues for breathing.  Stairs            Wheelchair Mobility    Modified Rankin (Stroke Patients Only)       Balance Overall balance assessment: Needs assistance Sitting-balance support: Feet supported;No upper extremity supported Sitting balance-Leahy Scale: Good Sitting balance - Comments: Able to being feet up and donn socks sitting EOB.   Standing balance support: During functional activity;Bilateral upper extremity supported Standing balance-Leahy Scale: Poor Standing balance comment: Reliant on BUEs for support in standing.                             Pertinent Vitals/Pain Pain Assessment: Faces Faces Pain Scale: Hurts little more Pain Location: bil knees- chronic Pain Descriptors / Indicators: Aching Pain Intervention(s): Monitored during session;Repositioned    Home Living Family/patient expects to be discharged to:: Private residence Living Arrangements: Spouse/significant other Available Help at Discharge: Family;Available 24 hours/day Type of Home: House Home Access: Stairs to enter Entrance Stairs-Rails:  Left;Right Entrance Stairs-Number of Steps: 5 Home Layout: Two level;Able to live on main level with bedroom/bathroom Home Equipment: Other (comment);Walker - 2 wheels;Cane - single point;Shower seat - built in (scooter)      Prior Function Level of Independence:  Independent with assistive device(s);Needs assistance   Gait / Transfers Assistance Needed: Uses RW for ambulation; gets very short of breath for household distances. Has to walk 40-50' to get to kitchen. Uses scooter for community ambulation. Granddaughter reports falls.  ADL's / Homemaking Assistance Needed: some assist from wife for ADLs. Family assists with driving/grocery shopping        Hand Dominance        Extremity/Trunk Assessment   Upper Extremity Assessment Upper Extremity Assessment: Defer to OT evaluation    Lower Extremity Assessment Lower Extremity Assessment: Generalized weakness    Cervical / Trunk Assessment Cervical / Trunk Assessment: Kyphotic  Communication   Communication: HOH (deaf in left ear)  Cognition Arousal/Alertness: Awake/alert Behavior During Therapy: WFL for tasks assessed/performed Overall Cognitive Status: Impaired/Different from baseline Area of Impairment: Safety/judgement         Safety/Judgement: Decreased awareness of safety;Decreased awareness of deficits          General Comments General comments (skin integrity, edema, etc.): daughter and granddaughter present during session.    Exercises     Assessment/Plan    PT Assessment Patient needs continued PT services  PT Problem List Decreased strength;Decreased mobility;Decreased balance;Decreased activity tolerance;Cardiopulmonary status limiting activity;Decreased cognition;Pain          PT Treatment Interventions DME instruction;Therapeutic activities;Therapeutic exercise;Gait training;Patient/family education;Balance training;Functional mobility training    PT Goals (Current goals can be found in the Care Plan section)  Acute Rehab PT Goals Patient Stated Goal: to go home PT Goal Formulation: With patient/family Time For Goal Achievement: 01/16/16 Potential to Achieve Goals: Fair    Frequency Min 3X/week   Barriers to discharge Decreased caregiver support not  sure how much wife can assist    Co-evaluation               End of Session Equipment Utilized During Treatment: Gait belt;Oxygen Activity Tolerance: Treatment limited secondary to medical complications (Comment) (drop in Sp02 to 60s, Rn notified) Patient left: in chair;with call bell/phone within reach;with family/visitor present;with chair alarm set Nurse Communication: Mobility status;Other (comment) (02)    Functional Assessment Tool Used: clinical judgment Functional Limitation: Mobility: Walking and moving around Mobility: Walking and Moving Around Current Status [phone](G8978): At least 40 percent but less than 60 percent impaired, limited or restricted Mobility: Walking and Moving Around Goal Status [phone]([id number]): At least 1 percent but less than 20 percent impaired, limited or restricted    Time: 0804-0830 PT Time Calculation (min) (ACUTE ONLY): 26 min   Charges:   PT Evaluation $PT Eval Moderate Complexity: 1 Procedure PT Treatments $Gait Training: 8-22 mins   PT G Codes:   PT G-Codes **NOT FOR INPATIENT CLASS** Functional Assessment Tool Used: clinical judgment Functional Limitation: Mobility: Walking and moving around Mobility: Walking and Moving Around Current Status ([phone]): At least 40 percent but less than 60 percent impaired, limited or restricted Mobility: Walking and Moving Around Goal Status [phone](G8979): At least 1 percent but less than 20 percent impaired, limited or restricted    Angalina Ante A Snigdha Howser 01/02/2016, 9:16 AM Mylo RedShauna Farah Benish, PT, DPT [phone]

## 2016-01-03 ENCOUNTER — Inpatient Hospital Stay (HOSPITAL_COMMUNITY): Payer: Medicare Other

## 2016-01-03 DIAGNOSIS — J181 Lobar pneumonia, unspecified organism: Secondary | ICD-10-CM | POA: Diagnosis present

## 2016-01-03 LAB — CBC WITH DIFFERENTIAL/PLATELET
BASOS ABS: 0 10*3/uL (ref 0.0–0.1)
Basophils Relative: 0 %
EOS PCT: 0 %
Eosinophils Absolute: 0 10*3/uL (ref 0.0–0.7)
HEMATOCRIT: 27.5 % — AB (ref 39.0–52.0)
HEMOGLOBIN: 9.1 g/dL — AB (ref 13.0–17.0)
LYMPHS PCT: 7 %
Lymphs Abs: 1 10*3/uL (ref 0.7–4.0)
MCH: 29.2 pg (ref 26.0–34.0)
MCHC: 33.1 g/dL (ref 30.0–36.0)
MCV: 88.1 fL (ref 78.0–100.0)
Monocytes Absolute: 0.8 10*3/uL (ref 0.1–1.0)
Monocytes Relative: 6 %
NEUTROS PCT: 87 %
Neutro Abs: 11.9 10*3/uL — ABNORMAL HIGH (ref 1.7–7.7)
Platelets: 279 10*3/uL (ref 150–400)
RBC: 3.12 MIL/uL — AB (ref 4.22–5.81)
RDW: 14.7 % (ref 11.5–15.5)
WBC: 13.7 10*3/uL — AB (ref 4.0–10.5)

## 2016-01-03 LAB — GLUCOSE, CAPILLARY
GLUCOSE-CAPILLARY: 164 mg/dL — AB (ref 65–99)
Glucose-Capillary: 164 mg/dL — ABNORMAL HIGH (ref 65–99)
Glucose-Capillary: 194 mg/dL — ABNORMAL HIGH (ref 65–99)
Glucose-Capillary: 171 mg/dL — ABNORMAL HIGH (ref 65–99)

## 2016-01-03 LAB — COMPREHENSIVE METABOLIC PANEL
ALT: 12 U/L — ABNORMAL LOW (ref 17–63)
AST: 21 U/L (ref 15–41)
Albumin: 2.2 g/dL — ABNORMAL LOW (ref 3.5–5.0)
Alkaline Phosphatase: 53 U/L (ref 38–126)
Anion gap: 12 (ref 5–15)
BUN: 44 mg/dL — AB (ref 6–20)
CHLORIDE: 98 mmol/L — AB (ref 101–111)
CO2: 21 mmol/L — ABNORMAL LOW (ref 22–32)
Calcium: 8.4 mg/dL — ABNORMAL LOW (ref 8.9–10.3)
Creatinine, Ser: 2.21 mg/dL — ABNORMAL HIGH (ref 0.61–1.24)
GFR, EST AFRICAN AMERICAN: 28 mL/min — AB (ref >60)
GFR, EST NON AFRICAN AMERICAN: 24 mL/min — AB (ref >60)
Glucose, Bld: 166 mg/dL — ABNORMAL HIGH (ref 65–99)
Potassium: 5.1 mmol/L (ref 3.5–5.1)
Sodium: 131 mmol/L — ABNORMAL LOW (ref 135–145)
Total Bilirubin: 0.3 mg/dL (ref 0.3–1.2)
Total Protein: 6 g/dL — ABNORMAL LOW (ref 6.5–8.1)

## 2016-01-03 LAB — URINE CULTURE: Culture: NO GROWTH

## 2016-01-03 MED ORDER — FUROSEMIDE 10 MG/ML IJ SOLN
20.0000 mg | Freq: Once | INTRAMUSCULAR | Status: AC
  Administered 2016-01-03: 20 mg via INTRAVENOUS
  Filled 2016-01-03: qty 4

## 2016-01-03 NOTE — Progress Notes (Signed)
MD paged. RN will continue to monitor.

## 2016-01-03 NOTE — Progress Notes (Signed)
PROGRESS NOTE  Christian Dominguez RUE:[id number] DOB: [birth year] DOA: 01/01/2016 PCP: No PCP Per Patient   LOS: 1 day   Brief Narrative: Christian Dominguez a 83 y.o.malewith medical history significant of DM/CKD/AAA repair presents with slurred speech. Pt was concerned he is having a stroke. CBG was done and it was 35 followed by 42. After administration of D50 the symptoms improved. Stroke pathway was cancelled, HCT was done which did not demonstrate any stroke. Pt has Emphysema 2/2 asbestos exposure, he is on 2 L O2 at home. This requirement increased while in the ER to 6L O2. He has had URI symptoms for the last couple days. Last Sunday he went to church and may have been exposed to some people who were sick. Not sure of any sick contacts at home. No recent hospital admissions or being in a facility.   Assessment & Plan: Principal Problem:   Hypoglycemia Active Problems:   Interstitial lung disease (HCC)   Pulmonary hypertension   URI with cough and congestion   Lobar pneumonia (HCC)  URI / CAP / Lobar pneumonia apices - On underlying emphysema and chronic pulmonary disease in the setting of asbestos exposure - CXR with increased upper lobe opacity, continue Levaquin - He is negative for influenza  Acute on chronic hypoxic respiratory failure - Patient on 2 L nasal cannula at home, desatting significantly with minimal activity in the setting of pneumonia - discussion with daughter at bedside today, he is desatting with difficulties recovering, unable to tolerate nonrebreather or BiPAP, confirmed DNR, and for now continue antibiotics and hope for recovery however his heels respiratory status is to continue to decline she wants patient comfortable and "not to suffer"  Hypoglycemia  - likely in setting of poor by mouth intake and infectious process with upper respiratory type symptoms - Resolved  Acute on chronic kidney disease stage IV - Creatinine has been recently in the 1.6  range, however it was as high as 2.5 to mid threes in the past, creatinine on presentation 2.8, likely due to dehydration, improved to 2.2 now, fluids discontinued, encourage by mouth intake  Hypertension - Blood pressure controlled, continue Norvasc, and hold losartan hydrochlorothiazide given renal failure   Hypothyroidism - Continue Synthroid  Hyperlipidemia - On statin  DVT prophylaxis: heparin Code Status: DNR Family Communication: d/w daughter bedside Disposition Plan: TBD  Consultants:   None   Procedures:   None   Antimicrobials:  Levaquin 12/31 >>   Subjective: - He is sleeping when I entered the room, wakes up easily, denies any shortness of breath, has no chest pain.   Objective: Vitals:   01/03/16 0557 01/03/16 0604 01/03/16 0658 01/03/16 0928  BP:    (!) 116/59  Pulse:    77  Resp:    (!) 32  Temp:    98.2 F (36.8 C)  TempSrc:    Oral  SpO2: 95% 97% (!) 84% 93%  Weight:      Height:        Intake/Output Summary (Last 24 hours) at 01/03/16 1059 Last data filed at 01/02/16 1700  Gross per 24 hour  Intake              400 ml  Output              80 0 ml  Net             -400 ml   Filed Weights   01/02/16 0225 01/03/16 0446 01/03/16 0513  Weight: 56.7 kg (  124 lb 14.4 oz) 61.1 kg (134 lb 12.8 oz) 61.3 kg (135 lb 3.2 oz)    Examination: Constitutional: NAD Vitals:   01/03/16 0557 01/03/16 0604 01/03/16 0658 01/03/16 0928  BP:    (!) 116/59  Pulse:    77  Resp:    (!) 32  Temp:    98.2 F (36.8 C)  TempSrc:    Oral  SpO2: 95% 97% (!) 84% 93%  Weight:      Height:       Eyes: PERRL, lids and conjunctivae normal Respiratory: Coarse breath sounds bilaterally, rhonchi present, increased respiratory effort Cardiovascular: Regular rate and rhythm, no murmurs / rubs / gallops.  Abdomen: no tenderness. Bowel sounds positive.  Musculoskeletal: no clubbing / cyanosis.  Neurologic: non focal    Data Reviewed: I have personally reviewed  following labs and imaging studies  CBC:  Recent Labs Lab 01/01/16 2211 01/01/16 2218 01/02/16 0453 01/03/16 0445  WBC 21.8*  --  15.5* 13.7*  NEUTROABS 17.4*  --   --  11.9*  HGB 10.4* 10.5* 9.3* 9.1*  HCT 30.8* 31.0* 28.6* 27.5*  MCV 89.3  --  88.8 88.1  PLT 268  --  265 279   Basic Metabolic Panel:  Recent Labs Lab 01/01/16 2211 01/01/16 2218 01/02/16 0453 01/03/16 0445  NA 132* 131* 132* 131*  K 5.1 5.1 5.0 5.1  CL 100* 99* 101 98*  CO2 21*  --  28 21*  GLUCOSE 35* 42* 83 166*  BUN 61* 64* 55* 44*  CREATININE 2.72* 2.80* 2.57* 2.21*  CALCIUM 9.2  --  8.6* 8.4*   GFR: Estimated Creatinine Clearance: 18.5 mL/min (by C-G formula based on SCr of 2.21 mg/dL (H)). Liver Function Tests:  Recent Labs Lab 01/01/16 2211 01/02/16 0453 01/03/16 0445  AST 25 23 21   ALT 13* 12* 12*  ALKPHOS 53 49 53  BILITOT 0.5 0.3 0.3  PROT 7.6 6.3* 6.0*  ALBUMIN 3.2* 2.5* 2.2*   No results for input(s): LIPASE, AMYLASE in the last 168 hours. No results for input(s): AMMONIA in the last 168 hours. Coagulation Profile:  Recent Labs Lab 01/01/16 2211  INR 0.98   Cardiac Enzymes: No results for input(s): CKTOTAL, CKMB, CKMBINDEX, TROPONINI in the last 168 hours. BNP (last 3 results) No results for input(s): PROBNP in the last 8760 hours. HbA1C: No results for input(s): HGBA1C in the last 72 hours. CBG:  Recent Labs Lab 01/02/16 0652 01/02/16 1132 01/02/16 1624 01/02/16 2123 01/03/16 0616  GLUCAP 74 153* 143* 172* 164*   Lipid Profile: No results for input(s): CHOL, HDL, LDLCALC, TRIG, CHOLHDL, LDLDIRECT in the last 72 hours. Thyroid Function Tests:  Recent Labs  01/02/16 0453  TSH 1.627   Anemia Panel: No results for input(s): VITAMINB12, FOLATE, FERRITIN, TIBC, IRON, RETICCTPCT in the last 72 hours. Urine analysis:    Component Value Date/Time   COLORURINE STRAW (A) 01/02/2016 0454   APPEARANCEUR CLEAR 01/02/2016 0454   LABSPEC 1.008 01/02/2016 0454    PHURINE 5.0 01/02/2016 0454   GLUCOSEU NEGATIVE 01/02/2016 0454   HGBUR SMALL (A) 01/02/2016 0454   BILIRUBINUR NEGATIVE 01/02/2016 0454   KETONESUR NEGATIVE 01/02/2016 0454   PROTEINUR NEGATIVE 01/02/2016 0454   UROBILINOGEN 1.0 07/16/2014 0928   NITRITE NEGATIVE 01/02/2016 0454   LEUKOCYTESUR NEGATIVE 01/02/2016 0454   Sepsis Labs: Invalid input(s): PROCALCITONIN, LACTICIDVEN  Recent Results (from the past 240 hour(s))  Urine culture     Status: None   Collection Time: 01/02/16  4:54 AM  Result Value Ref Range Status   Specimen Description URINE, CLEAN CATCH  Final   Special Requests NONE  Final   Culture NO GROWTH  Final   Report Status 01/03/2016 FINAL  Final      Radiology Studies: Dg Chest 2 View  Result Date: 01/02/2016 CLINICAL DATA:  Cough. EXAM: CHEST  2 VIEW COMPARISON:  Radiographs and CT July 2016 FINDINGS: Coarse reticular opacities diffusely throughout both lungs have progressed from prior exam. No confluent airspace disease. Unchanged heart size and mediastinal contours. No pleural fluid. No pneumothorax. Cholecystectomy clips in the right upper quadrant. No acute osseous abnormality. IMPRESSION: Diffuse bilateral coarse reticular opacities, progressed from prior exam. This may be progressive interstitial lung disease versus superimposed pulmonary edema. No focal airspace disease to suggest pneumonia. Electronically Signed   By: Rubye Oaks M.D.   On: 01/02/2016 00:36   Dg Chest Port 1 View  Result Date: 01/03/2016 CLINICAL DATA:  Dyspnea. HX diabetes, COPD, AAA. EXAM: PORTABLE CHEST 1 VIEW COMPARISON:  01/02/2016, 07/19/2014 FINDINGS: The heart is enlarged. Fibrotic changes are again identified throughout the lungs. Increased opacity in the lung apices may indicate infectious process and/or pulmonary edema versus shallow lung inflation. IMPRESSION: Increase upper lobe opacity. Follow-up is recommended. Persistent significant fibrotic changes throughout the lungs.  Electronically Signed   By: Norva Pavlov M.D.   On: 01/03/2016 08:37   Ct Head Code Stroke W/o Cm  Result Date: 01/01/2016 CLINICAL DATA:  Code stroke. Slurred speech, difficulty walking ; last seen normal at 2100 hours. History of diabetes. EXAM: CT HEAD WITHOUT CONTRAST TECHNIQUE: Contiguous axial images were obtained from the base of the skull through the vertex without intravenous contrast. COMPARISON:  None. FINDINGS: Mildly motion degraded examination. BRAIN: The ventricles and sulci are normal for age. No intraparenchymal hemorrhage, mass effect nor midline shift. Patchy supratentorial white matter hypodensities within normal range for patient's age, though non-specific are most compatible with chronic small vessel ischemic disease. No acute large vascular territory infarcts. No abnormal extra-axial fluid collections. Basal cisterns are patent. VASCULAR: Moderate calcific atherosclerosis of the carotid siphons. SKULL: No skull fracture. No significant scalp soft tissue swelling. SINUSES/ORBITS: Soft tissue within LEFT middle ear in mastoid air cells with air cell coalescence and, probable bony re- absorption of the ossicles. Mild paranasal sinus mucosal thickening with minimal air-fluid levels. The included ocular globes and orbital contents are non-suspicious. OTHER: None. ASPECTS [facility] Stroke Program Early CT Score) - Ganglionic level infarction (caudate, lentiform nuclei, internal capsule, insula, M1-M3 cortex): 7 - Supraganglionic infarction (M4-M6 cortex): 3 Total score (0-10 with 10 being normal): 10 IMPRESSION: 1. No acute intracranial process ; negative CT HEAD for age. Chronic LEFT otomastoiditis. 2. ASPECTS is 10. Critical Value/emergent results were called by telephone at the time of interpretation on 01/01/2016 at 10:35 pm to Dr. Roseanne Reno, Neurology , who verbally acknowledged these results. Electronically Signed   By: Awilda Metro M.D.   On: 01/01/2016 22:37     Scheduled  Meds: . amLODipine  10 mg Oral QHS  . heparin  5,000 Units Subcutaneous Q8H  . levofloxacin (LEVAQUIN) IV  500 mg Intravenous Q48H  . levothyroxine  75 mcg Oral QAC breakfast  . pravastatin  40 mg Oral QHS   Continuous Infusions:  Pamella Pert, MD, PhD [facility] Pager [phone]  If 7PM-7AM, please contact night-coverage [email or web address] Password [email or web address] 01/03/2016, 10:59 AM

## 2016-01-03 NOTE — Progress Notes (Signed)
PT Cancellation Note  Patient Details Name: Christian Dominguez MRN: [id number] DOB: 06/12/1921   Cancelled Treatment:    Reason Eval/Treat Not Completed: Fatigue/lethargy limiting ability to participate;Medical issues which prohibited therapy (Pt going home with hospice and resting comfortably, at this time family wishes to allow patient to rest.  Will defer tx today.  )   Florestine Avers 01/03/2016, 3:05 PM  Joycelyn Rua, PTA pager [phone]

## 2016-01-03 NOTE — Care Management Note (Signed)
Case Management Note  Patient Details  Name: Christian Dominguez MRN: [id number] Date of Birth: [birth year]  Subjective/Objective:  Pt to discharge home with hospice care, choice was [facility].  They will deliver hospital bed and arrange home visit in a.m.                              Expected Discharge Plan:  Home w Hospice Care  In-House Referral:     Discharge planning Services  CM Consult  Post Acute Care Choice:    Choice offered to:  Adult Children  DME Arranged:    DME Agency:     HH Arranged:  RN, Social Work [employer] Agency:  Other - See comment  Status of Service:  Completed, signed off  If discussed at [employer], dates discussed:    Additional Comments:  Magdalene River, RN 01/03/2016, 2:32 PM

## 2016-01-03 NOTE — Progress Notes (Signed)
Patient having difficulty maintaining O2 goal. Patient O2 level in 70's. Patient O2 increased to 5-6L [place]. NRMask placed on patient when unable to recover. Patient would stabilize on 4 L 02 NRmask. Patient unable to tolerate mask, awakened from sleep x 2 SOB, oral cyanosis, RR 40, short shallow breathes. MD paged. RN will continue to monitor.

## 2016-01-04 LAB — BASIC METABOLIC PANEL
ANION GAP: 9 (ref 5–15)
BUN: 50 mg/dL — AB (ref 6–20)
CHLORIDE: 97 mmol/L — AB (ref 101–111)
CO2: 25 mmol/L (ref 22–32)
Calcium: 8.5 mg/dL — ABNORMAL LOW (ref 8.9–10.3)
Creatinine, Ser: 2.16 mg/dL — ABNORMAL HIGH (ref 0.61–1.24)
GFR calc Af Amer: 29 mL/min — ABNORMAL LOW (ref >60)
GFR, EST NON AFRICAN AMERICAN: 25 mL/min — AB (ref >60)
Glucose, Bld: 206 mg/dL — ABNORMAL HIGH (ref 65–99)
POTASSIUM: 5.2 mmol/L — AB (ref 3.5–5.1)
SODIUM: 131 mmol/L — AB (ref 135–145)

## 2016-01-04 LAB — GLUCOSE, CAPILLARY
GLUCOSE-CAPILLARY: 124 mg/dL — AB (ref 65–99)
GLUCOSE-CAPILLARY: 217 mg/dL — AB (ref 65–99)
GLUCOSE-CAPILLARY: 159 mg/dL — AB (ref 65–99)
Glucose-Capillary: 132 mg/dL — ABNORMAL HIGH (ref 65–99)

## 2016-01-04 LAB — HEMOGLOBIN A1C
Hgb A1c MFr Bld: 5.6 % (ref 4.8–5.6)
Mean Plasma Glucose: 114 mg/dL

## 2016-01-04 MED ORDER — LEVOFLOXACIN 500 MG PO TABS: 500.0000 mg | ORAL_TABLET | ORAL | 0 refills | Status: AC

## 2016-01-04 NOTE — Discharge Summary (Signed)
Physician Discharge Summary  Christian Dominguez WUJ:[id number] DOB: 1921/02/27 DOA: 01/01/2016  PCP: No PCP Per Patient  Admit date: 01/01/2016 Discharge date: 01/04/2016  Admitted From: home Disposition:  Home with hospice  Recommendations for Outpatient Follow-up:  1. Follow up with hospice services  2. Levaquin for 3 additional days  Home Health: hospice Equipment/Devices: hospital bed  Discharge Condition: guarded, with hospice CODE STATUS: DNR Diet recommendation: as tolerated  HPI: per Dr. Dorma Russell, Christian Dominguez is a 83 y.o. male with medical history significant of DM/CKD/AAA repair presents with slurred speech.  Pt was concerned he is having a stroke.   CBG was done and it was 35 followed by 42.  After administration of D50 the symptoms improved.  Stroke pathway was cancelled, HCT was done which did not demonstrate any stroke.  Pt has Emphysema 2/2 asbestos exposure, he is on 2 L O2 at home.  This requirement increased while in the ER to 6L O2.  He has had URI symptoms for the last couple days.  Last Sunday he went to church and may have been exposed to some people who were sick.  Not sure of any sick contacts at home.  No recent hospital admissions or being in a facility.  + cough, sob, weakness, decrease appetite, poor intake. He denies any chest pain, palpitations, hemoptysis, nausea, vomiting, fever, chills, abdominal pain, dysuria, hematuria, polyuria, nocturia, BRBPR, melena, hematochezia, heat/cold intolerance, orthopnea, PND, or leg swelling ED Course: Given D50, HCT negative for stroke   Hospital Course: Discharge Diagnoses:  Principal Problem:   Hypoglycemia Active Problems:   Interstitial lung disease (HCC)   Pulmonary hypertension   URI with cough and congestion   Lobar pneumonia (HCC)   URI / CAP / Lobar pneumonia apices - On underlying emphysema and chronic pulmonary disease in the setting of asbestos exposure, with very poor lung reserves at baseline. CXR with  increased upper lobe opacity. Patient started on Levaquin, continue, will need 3 additional doses at home. He tested negative for influenza Acute on chronic hypoxic respiratory failure - Patient on 2 L nasal cannula at home, desatting significantly with minimal activity in the setting of pneumonia, discussion with daughter at bedside, he is desatting with difficulties recovering, unable to tolerate nonrebreather or BiPAP. Family would like to take patient home per his wishes and hope for recovery however understand that he may not recover. They would like hospice to follow at home, which was arranged prior to d/c. If patient is to decline their wishes are for comfort. Hypoglycemia  - likely in setting of poor by mouth intake and infectious process with upper respiratory type symptoms. Resolved Acute on chronic kidney disease stage IV - Creatinine has been recently in the 1.6 range, however it was as high as 2.5 to mid threes in the past, creatinine on presentation 2.8, likely due to dehydration, improved with fluids, Cr 2.1 on d/c Hypertension - Blood pressure controlled, continue Norvasc,hold losartan hydrochlorothiazide given renal failure  Hypothyroidism - Continue Synthroid Hyperlipidemia - On statin   Discharge Instructions   Allergies as of 01/04/2016   No Known Allergies     Medication List    STOP taking these medications   losartan-hydrochlorothiazide 100-12.5 MG tablet Commonly known as:  HYZAAR     TAKE these medications   acetaminophen 650 MG CR tablet Commonly known as:  TYLENOL Take 650 mg by mouth every 8 (eight) hours as needed for pain.   amLODipine 10 MG tablet Commonly known as:  NORVASC Take 10 mg by mouth at bedtime.   glimepiride 1 MG tablet Commonly known as:  AMARYL Take 1 mg by mouth daily with breakfast.   HYDROcodone-acetaminophen 5-325 MG tablet Commonly known as:  NORCO/VICODIN Take 1-2 tablets by mouth every 4 (four) hours as needed for moderate pain  or severe pain.   levofloxacin 500 MG tablet Commonly known as:  LEVAQUIN Take 1 tablet (500 mg total) by mouth every other day. Start taking on:  01/06/2016   levothyroxine 75 MCG tablet Commonly known as:  SYNTHROID, LEVOTHROID Take 75 mcg by mouth daily before breakfast.   pravastatin 40 MG tablet Commonly known as:  PRAVACHOL Take 40 mg by mouth at bedtime.       No Known Allergies  Consultations:  None   Procedures/Studies:  Dg Chest 2 View  Result Date: 01/02/2016 CLINICAL DATA:  Cough. EXAM: CHEST  2 VIEW COMPARISON:  Radiographs and CT July 2016 FINDINGS: Coarse reticular opacities diffusely throughout both lungs have progressed from prior exam. No confluent airspace disease. Unchanged heart size and mediastinal contours. No pleural fluid. No pneumothorax. Cholecystectomy clips in the right upper quadrant. No acute osseous abnormality. IMPRESSION: Diffuse bilateral coarse reticular opacities, progressed from prior exam. This may be progressive interstitial lung disease versus superimposed pulmonary edema. No focal airspace disease to suggest pneumonia. Electronically Signed   By: Rubye Oaks M.D.   On: 01/02/2016 00:36   Dg Chest Port 1 View  Result Date: 01/03/2016 CLINICAL DATA:  Dyspnea. HX diabetes, COPD, AAA. EXAM: PORTABLE CHEST 1 VIEW COMPARISON:  01/02/2016, 07/19/2014 FINDINGS: The heart is enlarged. Fibrotic changes are again identified throughout the lungs. Increased opacity in the lung apices may indicate infectious process and/or pulmonary edema versus shallow lung inflation. IMPRESSION: Increase upper lobe opacity. Follow-up is recommended. Persistent significant fibrotic changes throughout the lungs. Electronically Signed   By: Norva Pavlov M.D.   On: 01/03/2016 08:37   Ct Head Code Stroke W/o Cm  Result Date: 01/01/2016 CLINICAL DATA:  Code stroke. Slurred speech, difficulty walking ; last seen normal at 2100 hours. History of diabetes. EXAM: CT  HEAD WITHOUT CONTRAST TECHNIQUE: Contiguous axial images were obtained from the base of the skull through the vertex without intravenous contrast. COMPARISON:  None. FINDINGS: Mildly motion degraded examination. BRAIN: The ventricles and sulci are normal for age. No intraparenchymal hemorrhage, mass effect nor midline shift. Patchy supratentorial white matter hypodensities within normal range for patient's age, though non-specific are most compatible with chronic small vessel ischemic disease. No acute large vascular territory infarcts. No abnormal extra-axial fluid collections. Basal cisterns are patent. VASCULAR: Moderate calcific atherosclerosis of the carotid siphons. SKULL: No skull fracture. No significant scalp soft tissue swelling. SINUSES/ORBITS: Soft tissue within LEFT middle ear in mastoid air cells with air cell coalescence and, probable bony re- absorption of the ossicles. Mild paranasal sinus mucosal thickening with minimal air-fluid levels. The included ocular globes and orbital contents are non-suspicious. OTHER: None. ASPECTS [facility] Stroke Program Early CT Score) - Ganglionic level infarction (caudate, lentiform nuclei, internal capsule, insula, M1-M3 cortex): 7 - Supraganglionic infarction (M4-M6 cortex): 3 Total score (0-10 with 10 being normal): 10 IMPRESSION: 1. No acute intracranial process ; negative CT HEAD for age. Chronic LEFT otomastoiditis. 2. ASPECTS is 10. Critical Value/emergent results were called by telephone at the time of interpretation on 01/01/2016 at 10:35 pm to Dr. Roseanne Reno, Neurology , who verbally acknowledged these results. Electronically Signed   By: Awilda Metro M.D.   On:  01/01/2016 22:37     Subjective: - sleeping, appears comfortable  Discharge Exam: Vitals:   01/04/16 0944 01/04/16 1326  BP: (!) 103/52 (!) 109/48  Pulse: 68 68  Resp: 20 20  Temp: 97.2 F (36.2 C) 98.5 F (36.9 C)   Vitals:   01/04/16 0346 01/04/16 0551 01/04/16 0944 01/04/16  1326  BP:  (!) 116/46 (!) 103/52 (!) 109/48  Pulse:  70 68 68  Resp:  20 20 20   Temp:  98.3 F (36.8 C) 97.2 F (36.2 C) 98.5 F (36.9 C)  TempSrc:  Axillary Axillary Oral  SpO2:  99% 99% 95%  Weight: 58 kg (127 lb 14.4 oz)     Height:       Cardiovascular: RRR, S1/S2 +, no rubs, no gallops Respiratory: coarse breath sounds bilaterally   The results of significant diagnostics from this hospitalization (including imaging, microbiology, ancillary and laboratory) are listed below for reference.     Microbiology: Recent Results (from the past 240 hour(s))  Urine culture     Status: None   Collection Time: 01/02/16  4:54 AM  Result Value Ref Range Status   Specimen Description URINE, CLEAN CATCH  Final   Special Requests NONE  Final   Culture NO GROWTH  Final   Report Status 01/03/2016 FINAL  Final     Labs: BNP (last 3 results) No results for input(s): BNP in the last 8760 hours. Basic Metabolic Panel:  Recent Labs Lab 01/01/16 2211 01/01/16 2218 01/02/16 0453 01/03/16 0445 01/04/16 1124  NA 132* 131* 132* 131* 131*  K 5.1 5.1 5.0 5.1 5.2*  CL 100* 99* 101 98* 97*  CO2 21*  --  28 21* 25  GLUCOSE 35* 42* 83 166* 206*  BUN 61* 64* 55* 44* 50*  CREATININE 2.72* 2.80* 2.57* 2.21* 2.16*  CALCIUM 9.2  --  8.6* 8.4* 8.5*   Liver Function Tests:  Recent Labs Lab 01/01/16 2211 01/02/16 0453 01/03/16 0445  AST 25 23 21   ALT 13* 12* 12*  ALKPHOS 53 49 53  BILITOT 0.5 0.3 0.3  PROT 7.6 6.3* 6.0*  ALBUMIN 3.2* 2.5* 2.2*   No results for input(s): LIPASE, AMYLASE in the last 168 hours. No results for input(s): AMMONIA in the last 168 hours. CBC:  Recent Labs Lab 01/01/16 2211 01/01/16 2218 01/02/16 0453 01/03/16 0445  WBC 21.8*  --  15.5* 13.7*  NEUTROABS 17.4*  --   --  11.9*  HGB 10.4* 10.5* 9.3* 9.1*  HCT 30.8* 31.0* 28.6* 27.5*  MCV 89.3  --  88.8 88.1  PLT 268  --  265 279   Cardiac Enzymes: No results for input(s): CKTOTAL, CKMB, CKMBINDEX,  TROPONINI in the last 168 hours. BNP: Invalid input(s): POCBNP CBG:  Recent Labs Lab 01/03/16 1105 01/03/16 1630 01/03/16 2109 01/04/16 0629 01/04/16 1114  GLUCAP 194* 164* 171* 124* 217*   D-Dimer No results for input(s): DDIMER in the last 72 hours. Hgb A1c  Recent Labs  01/02/16 0453  HGBA1C 5.6   Lipid Profile No results for input(s): CHOL, HDL, LDLCALC, TRIG, CHOLHDL, LDLDIRECT in the last 72 hours. Thyroid function studies  Recent Labs  01/02/16 0453  TSH 1.627   Anemia work up No results for input(s): VITAMINB12, FOLATE, FERRITIN, TIBC, IRON, RETICCTPCT in the last 72 hours. Urinalysis    Component Value Date/Time   COLORURINE STRAW (A) 01/02/2016 0454   APPEARANCEUR CLEAR 01/02/2016 0454   LABSPEC 1.008 01/02/2016 0454   PHURINE 5.0 01/02/2016 0454  GLUCOSEU NEGATIVE 01/02/2016 0454   HGBUR SMALL (A) 01/02/2016 0454   BILIRUBINUR NEGATIVE 01/02/2016 0454   KETONESUR NEGATIVE 01/02/2016 0454   PROTEINUR NEGATIVE 01/02/2016 0454   UROBILINOGEN 1.0 07/16/2014 0928   NITRITE NEGATIVE 01/02/2016 0454   LEUKOCYTESUR NEGATIVE 01/02/2016 0454   Sepsis Labs Invalid input(s): PROCALCITONIN,  WBC,  LACTICIDVEN Microbiology Recent Results (from the past 240 hour(s))  Urine culture     Status: None   Collection Time: 01/02/16  4:54 AM  Result Value Ref Range Status   Specimen Description URINE, CLEAN CATCH  Final   Special Requests NONE  Final   Culture NO GROWTH  Final   Report Status 01/03/2016 FINAL  Final    Time coordinating discharge: Over 30 minutes  SIGNED:  Pamella Pert, MD  [facility] 01/04/2016, 3:50 PM Pager [phone]  If 7PM-7AM, please contact night-coverage [email or web address] Password [email or web address]

## 2016-01-04 NOTE — Discharge Instructions (Signed)
Follow up with hospice services as arranged  Follow with primary MD in 2-3 weeks  Please get a complete blood count and chemistry panel checked by your Primary MD at your next visit, and again as instructed by your Primary MD. Please get your medications reviewed and adjusted by your Primary MD.  Please request your Primary MD to go over all Hospital Tests and Procedure/Radiological results at the follow up, please get all Hospital records sent to your Prim MD by signing hospital release before you go home.  If you had Pneumonia of Lung problems at the Hospital: Please get a 2 view Chest X ray done in 6-8 weeks after hospital discharge or sooner if instructed by your Primary MD.  If you have Congestive Heart Failure: Please call your Cardiologist or Primary MD anytime you have any of the following symptoms:  1) 3 pound weight gain in 24 hours or 5 pounds in 1 week  2) shortness of breath, with or without a dry hacking cough  3) swelling in the hands, feet or stomach  4) if you have to sleep on extra pillows at night in order to breathe  Follow cardiac low salt diet and 1.5 lit/day fluid restriction.  If you have diabetes Accuchecks 4 times/day, Once in AM empty stomach and then before each meal. Log in all results and show them to your primary doctor at your next visit. If any glucose reading is under 80 or above 300 call your primary MD immediately.  If you have Seizure/Convulsions/Epilepsy: Please do not drive, operate heavy machinery, participate in activities at heights or participate in high speed sports until you have seen by Primary MD or a Neurologist and advised to do so again.  If you had Gastrointestinal Bleeding: Please ask your Primary MD to check a complete blood count within one week of discharge or at your next visit. Your endoscopic/colonoscopic biopsies that are pending at the time of discharge, will also need to followed by your Primary MD.  Get Medicines reviewed  and adjusted. Please take all your medications with you for your next visit with your Primary MD  Please request your Primary MD to go over all hospital tests and procedure/radiological results at the follow up, please ask your Primary MD to get all Hospital records sent to his/her office.  If you experience worsening of your admission symptoms, develop shortness of breath, life threatening emergency, suicidal or homicidal thoughts you must seek medical attention immediately by calling 911 or calling your MD immediately  if symptoms less severe.  You must read complete instructions/literature along with all the possible adverse reactions/side effects for all the Medicines you take and that have been prescribed to you. Take any new Medicines after you have completely understood and accpet all the possible adverse reactions/side effects.   Do not drive or operate heavy machinery when taking Pain medications.   Do not take more than prescribed Pain, Sleep and Anxiety Medications  Special Instructions: If you have smoked or chewed Tobacco  in the last 2 yrs please stop smoking, stop any regular Alcohol  and or any Recreational drug use.  Wear Seat belts while driving.  Please note You were cared for by a hospitalist during your hospital stay. If you have any questions about your discharge medications or the care you received while you were in the hospital after you are discharged, you can call the unit and asked to speak with the hospitalist on call if the hospitalist that took  care of you is not available. Once you are discharged, your primary care physician will handle any further medical issues. Please note that NO REFILLS for any discharge medications will be authorized once you are discharged, as it is imperative that you return to your primary care physician (or establish a relationship with a primary care physician if you do not have one) for your aftercare needs so that they can reassess your  need for medications and monitor your lab values.  You can reach the hospitalist office at phone [phone] or fax [phone]   If you do not have a primary care physician, you can call [phone] for a physician referral.  Activity: As tolerated with Full fall precautions use walker/cane & assistance as needed  Diet: as tolerated  Disposition Home

## 2016-01-04 NOTE — Progress Notes (Signed)
CM has met with the patient and his daughter about the plan for discharge. Pt already set up with [facility] and DME arranged through [employer]. CM spoke with Amy at [facility] to ask about the time for DME delivery. Amy to find out and call daughter. Pts daughter informed CM that the bed should arrive between 5-6pm.  Currently the patients home does not have power. Patients son in law is waiting at the home so we can plan for transportation of the patient home. Pts daughter would like him to discharge home tonight if able and power is back on. Pt is oxygen dependent at home and requires power for his concentrator and for the bed. CM prepared the transportation form for the patient and provided the number for ambulance service for the bedside RN if able to d/c tonight. CM following.

## 2016-01-05 LAB — GLUCOSE, CAPILLARY: Glucose-Capillary: 139 mg/dL — ABNORMAL HIGH (ref 65–99)

## 2016-01-05 MED ORDER — IPRATROPIUM-ALBUTEROL 0.5-2.5 (3) MG/3ML IN SOLN: 3.0000 mL | RESPIRATORY_TRACT | 0 refills | Status: AC | PRN

## 2016-01-05 NOTE — Care Management Note (Signed)
Case Management Note  Patient Details  Name: Christian Dominguez MRN: [id number] Date of Birth: [birth year]  Subjective/Objective:                    Action/Plan: Pt discharging home with [facility] Per daughter the bed is to be delivered at 0930. Daughter states patient can sit in recliner chair at home if needed until bed is set up and working. Pt on oxygen at 5L. Daughter states his concentrator goes to 5L at home. MD ordered pt nebulizer machine for home. CM spoke to [facility] with [facility] DME and they are going to have machine delivered to room by 10:00. CM arranged [facility] to transport patient home. [facility] set up for 10:00am. Bedside RN aware.   Expected Discharge Date:                  Expected Discharge Plan:  Home w Hospice Care  In-House Referral:     Discharge planning Services  CM Consult  Post Acute Care Choice:    Choice offered to:  Adult Children  DME Arranged:  Community education officer DME Agency:  [facility]  HH Arranged:  RN, Social Work [employer] Agency:  Other - See comment  Status of Service:  Completed, signed off  If discussed at [employer], dates discussed:    Additional Comments:  Kermit Balo, RN 01/05/2016, 9:27 AM

## 2016-01-05 NOTE — Progress Notes (Addendum)
Pt discharged home. Please see discharge summary from Dr. Lafe Garin, added nebulizer treatment to pt's medical list. Pt will be discharged home with hospice, daughter at bedside and in agreement.   Debbora Presto, MD  [facility] Pager [phone] Cell [phone]  If 7PM-7AM, please contact night-coverage [email or web address] Password [email or web address]

## 2016-02-03 DEATH — deceased

## 2016-02-09 ENCOUNTER — Encounter: Payer: Self-pay | Admitting: Cardiology

## 2016-07-02 IMAGING — CT CT CHEST HIGH RESOLUTION W/O CM
2 of 5 series · 14 of 36 positions shown, 17 images · non-contrast
Comparison: No priors.

CLINICAL DATA: [AGE] male with shortness of breath. History
of interstitial lung disease. History of stent graft repair for
abdominal aortic aneurysm. Awaiting cholecystectomy.

EXAM:
CT CHEST WITHOUT CONTRAST
TECHNIQUE: Multidetector CT imaging of the chest was performed following the
standard protocol without intravenous contrast. High resolution
imaging of the lungs, as well as inspiratory and expiratory imaging,
was performed.

[Series 5: high resolution 5.0 b40f · axial · 0.69mm/px · z∈[+1280,+1480]mm · 11 of 48 slices shown, 14 images]
[im 4/48  mediastinal]
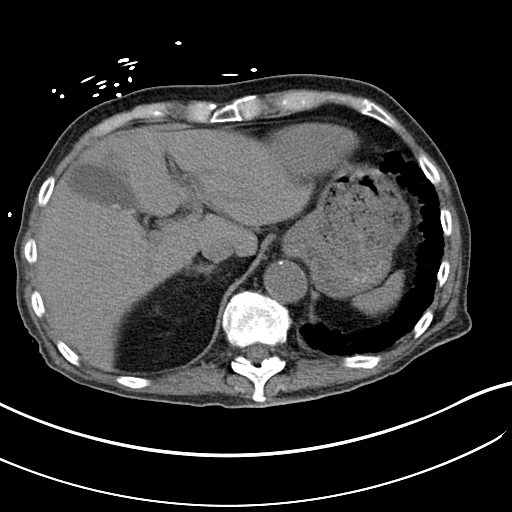
[im 4/48  lung]
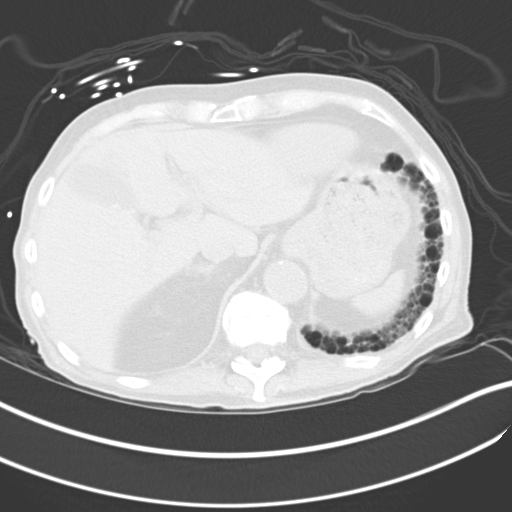
[im 7/48  lung]
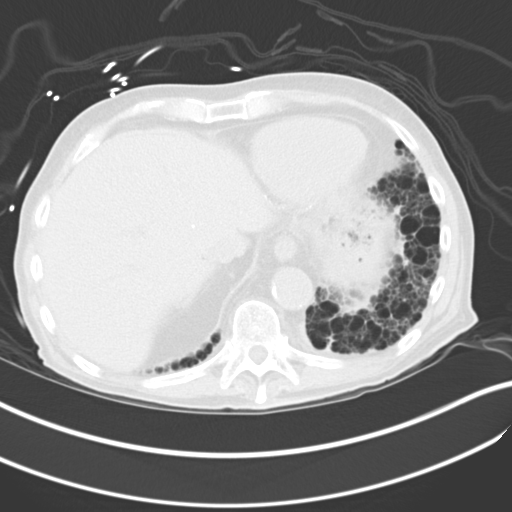
[im 13/48  lung]
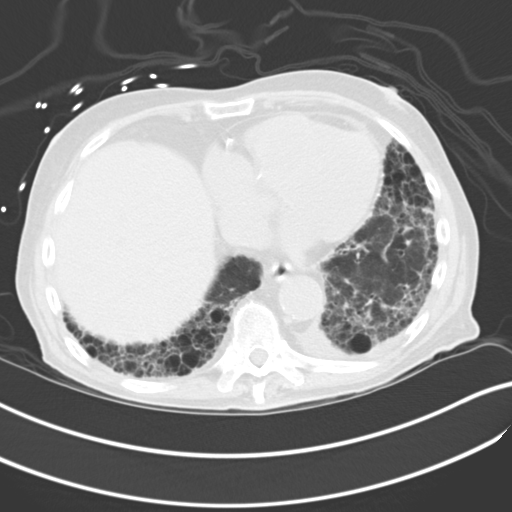
[im 16/48  lung]
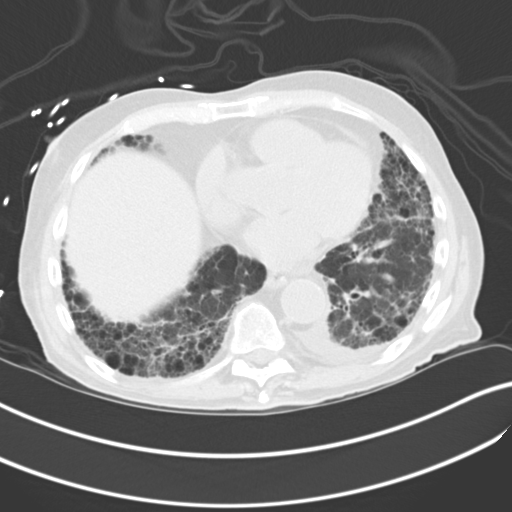
[im 19/48  mediastinal]
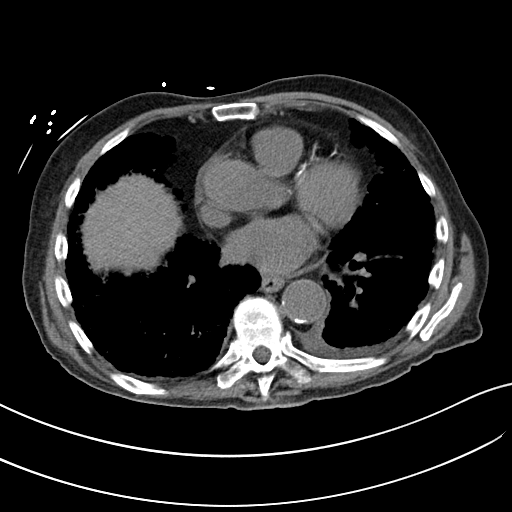
[im 19/48  lung]
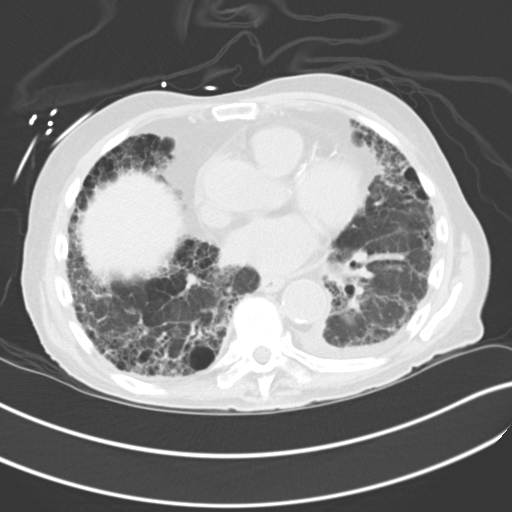
[im 26/48  lung]
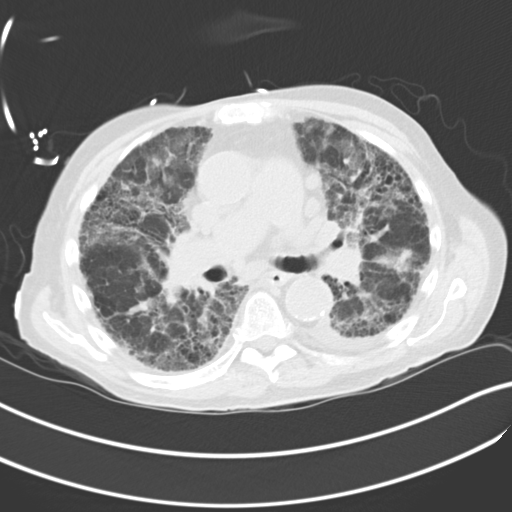
[im 29/48  lung]
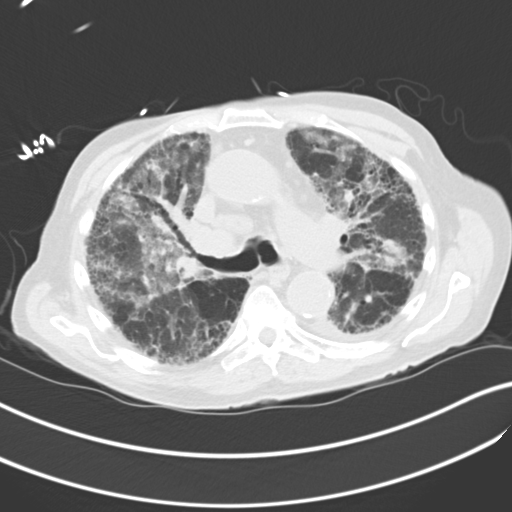
[im 32/48  lung]
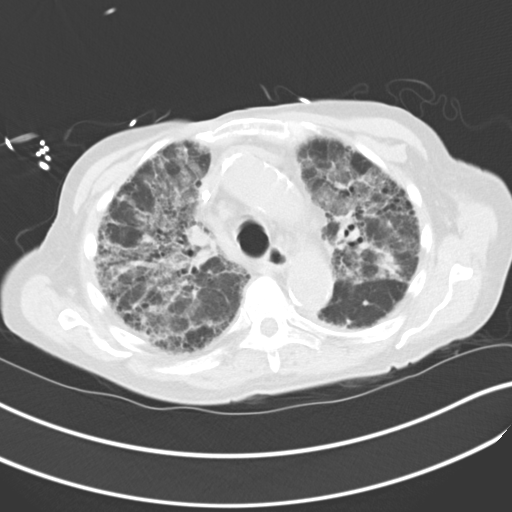
[im 35/48  mediastinal]
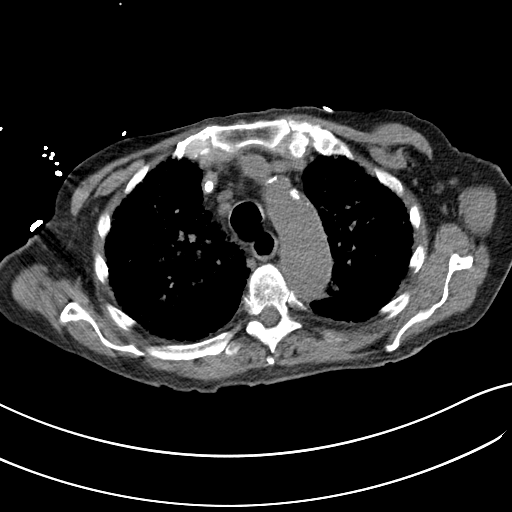
[im 35/48  lung]
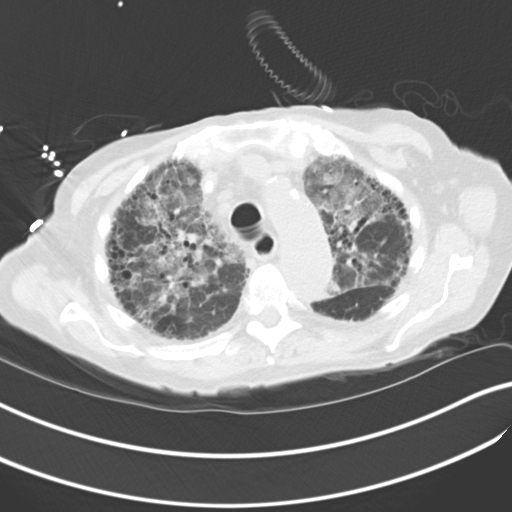
[im 41/48  lung]
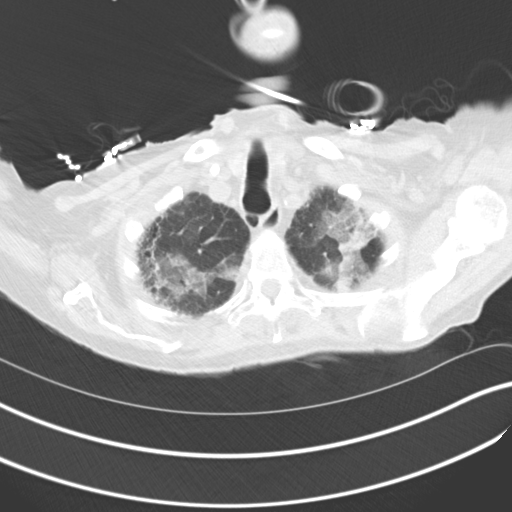
[im 44/48  lung]
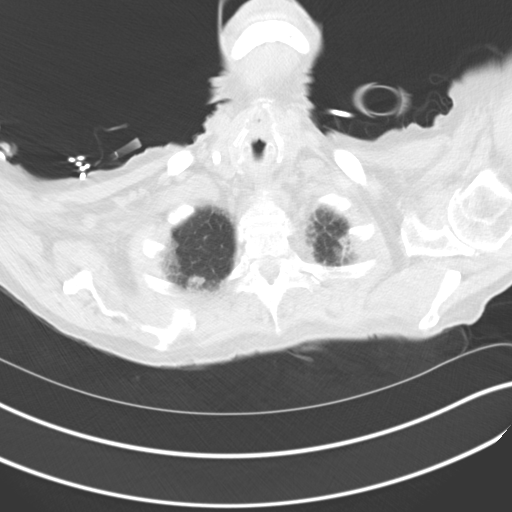

[Series 8: coronal · coronal · 0.55mm/px · 3 of 59 slices shown]
[im 12/59  lung]
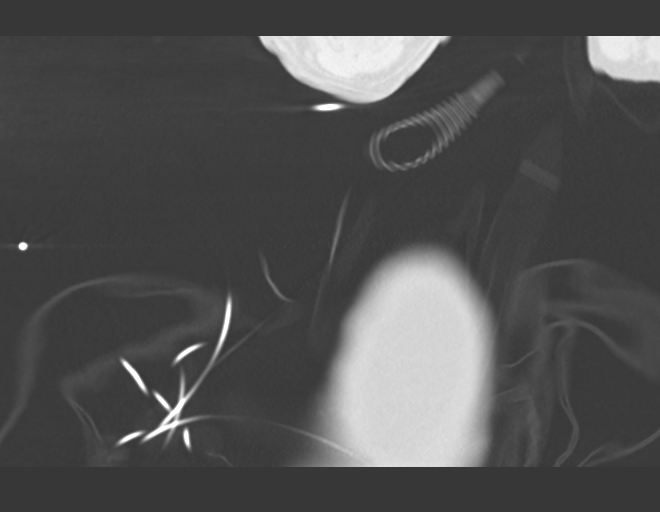
[im 24/59  lung]
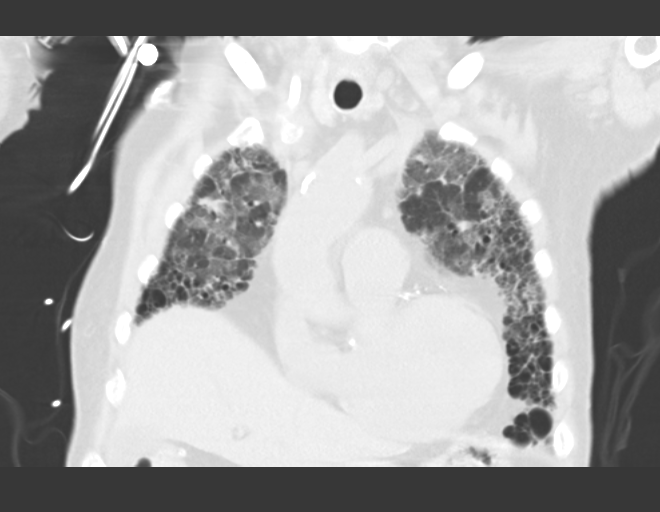
[im 35/59  lung]
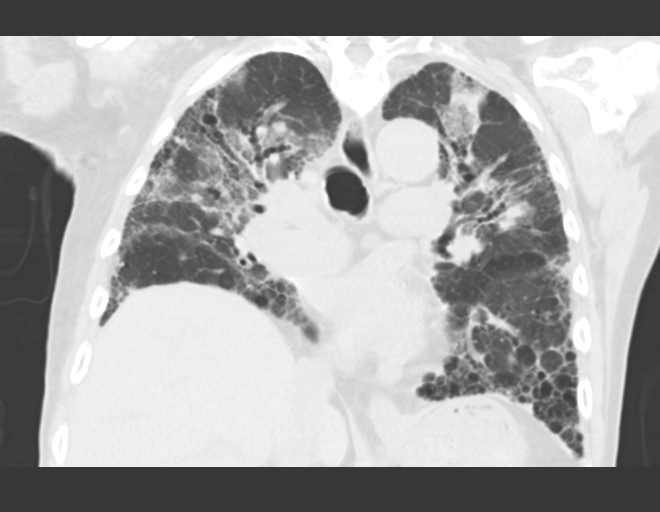

[14 of 36 positions shown; findings below may reference images not displayed]

FINDINGS: Mediastinum/Lymph Nodes: Heart size is mildly enlarged. There is no
significant pericardial fluid, thickening or pericardial
calcification. There is atherosclerosis of the thoracic aorta, the
great vessels of the mediastinum and the coronary arteries,
including calcified atherosclerotic plaque in the left main, left
anterior descending, left circumflex and right coronary arteries.
Dilatation of the pulmonic trunk (4.2 cm in diameter), and the main
pulmonary arteries, suggestive of pulmonary arterial hypertension.
Multiple prominent borderline and mildly enlarged mediastinal and
bilateral hilar lymph nodes measuring up to 1.5 cm in short axis in
the low right paratracheal station. Esophagus is unremarkable in
appearance. No axillary lymphadenopathy.

Lungs/Pleura: Throughout the lungs bilaterally there are extensive
areas of subpleural and peribronchovascular reticulation with
associated traction bronchiectasis and areas of frank honeycombing,
with a definitive craniocaudal gradient. Additionally, predominantly
in the mid to upper lungs there is concurrent ground-glass
attenuation, likely to reflect some underlying pulmonary edema.
Inspiratory and expiratory imaging is unremarkable. Small left
pleural effusion layering dependently.

Upper Abdomen: Numerous calcified gallstones within the gallbladder.
Gallbladder wall appears mildly thickened (4 mm), but there are no
overt surrounding inflammatory changes in the visualized upper
abdomen at this time to strongly suggest presence of an acute
cholecystitis.

Musculoskeletal/Soft Tissues: There are no aggressive appearing
lytic or blastic lesions noted in the visualized portions of the
skeleton.
IMPRESSION: 1. The appearance of the lungs is compatible with interstitial lung
disease, and the spectrum of findings is considered diagnostic of
usual interstitial pneumonia (UIP). In addition, mid to upper lung
predominant ground-glass attenuation is favored to reflect
superimposed pulmonary edema. The possibility of acute infection/
inflammation in these regions is not excluded, but is not strongly
favored.
2. Dilatation of the pulmonic trunk and main pulmonary arteries,
suggestive of pulmonary arterial hypertension.
3. Atherosclerosis, including left main and 3 vessel coronary artery
disease. Please note that although the presence of coronary artery
calcium documents the presence of coronary artery disease, the
severity of this disease and any potential stenosis cannot be
assessed on this non-gated CT examination. Assessment for potential
risk factor modification, dietary therapy or pharmacologic therapy
may be warranted, if clinically indicated.
4. Numerous borderline enlarged and mildly enlarged mediastinal and
hilar lymph nodes, presumably reactive in the setting of
interstitial lung disease.
5. Cholelithiasis.

## 2016-07-03 IMAGING — RF DG CHOLANGIOGRAM OPERATIVE
1 series · 4 of 4 positions shown · non-contrast
Comparison: CT abdomen and pelvis - 07/15/2014

CLINICAL DATA: Intraoperative cholangiogram during laparoscopic
cholecystectomy.

EXAM:
INTRAOPERATIVE CHOLANGIOGRAM
FLUOROSCOPY TIME:  15 seconds

[Series 1: run · 4 of 88 frames shown]
[frame 14/88]
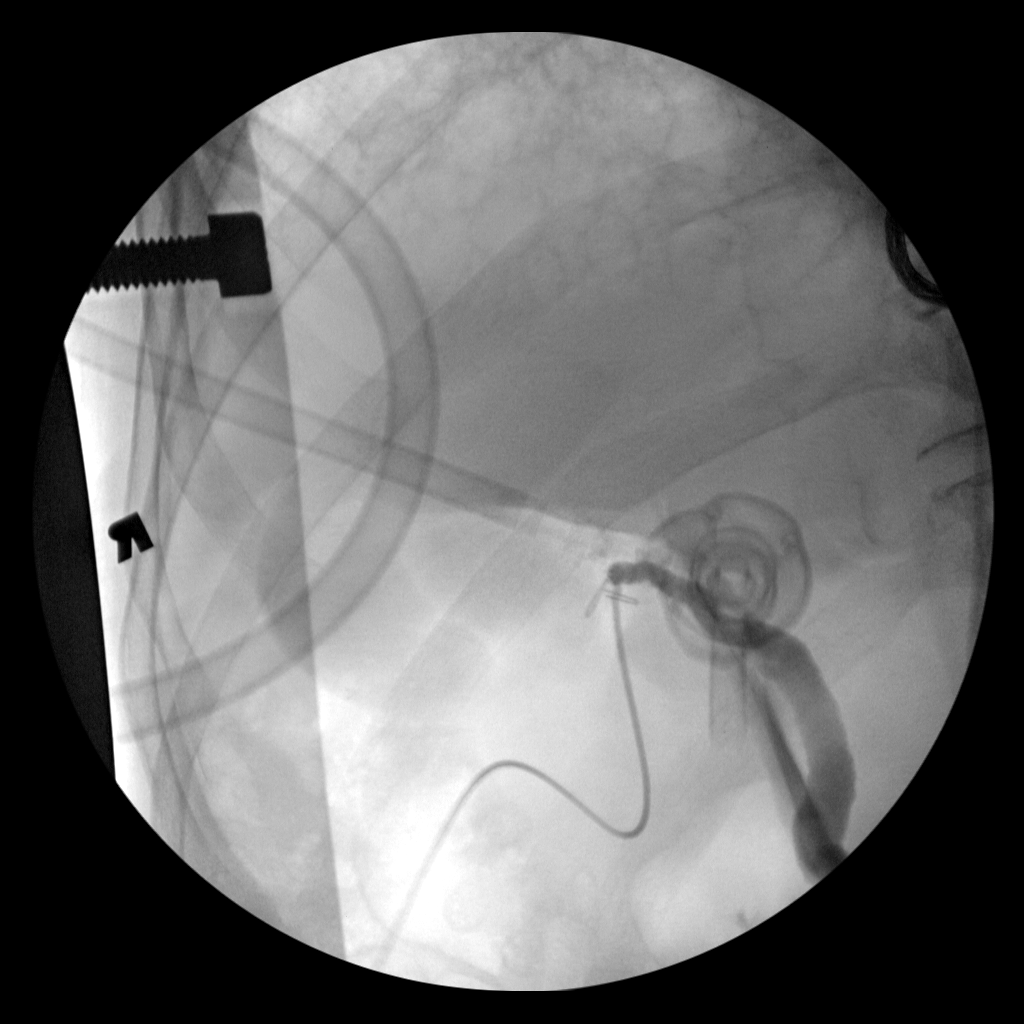
[frame 26/88]
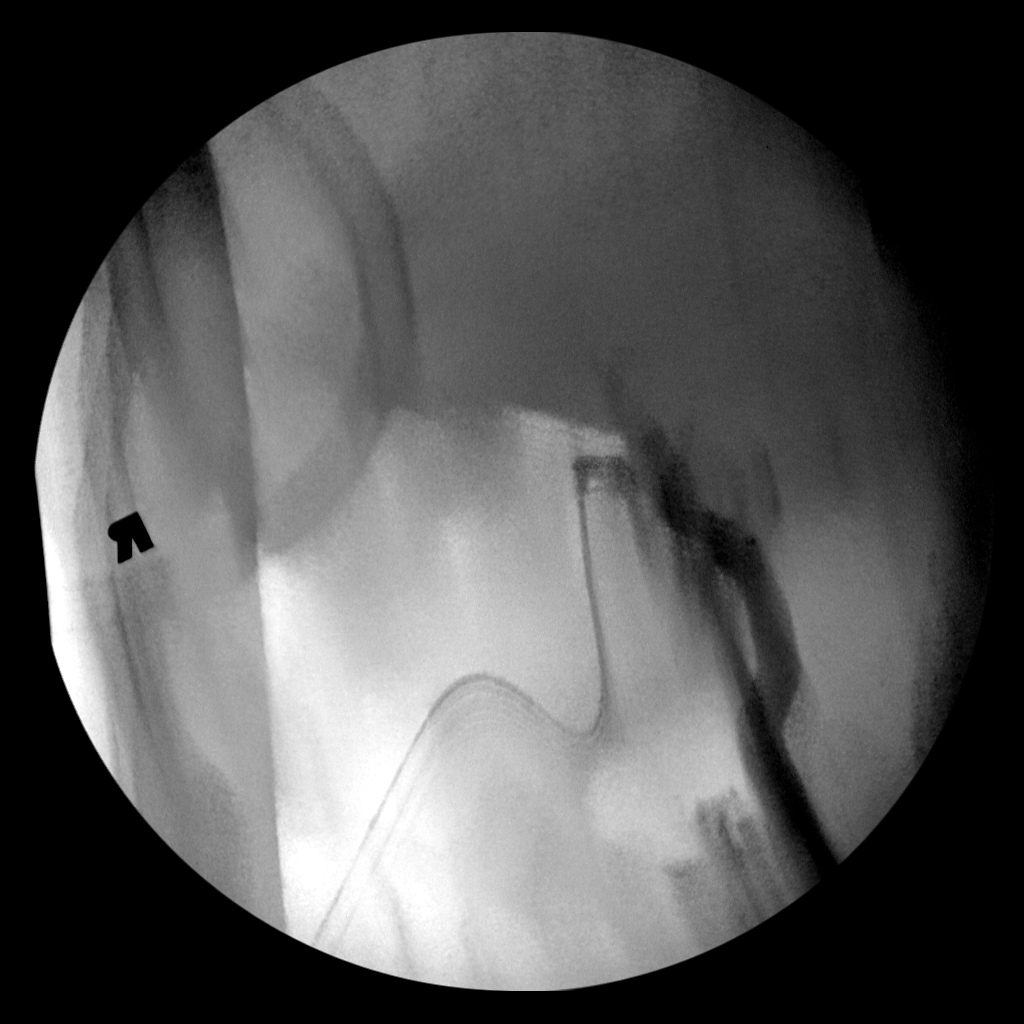
[frame 45/88]
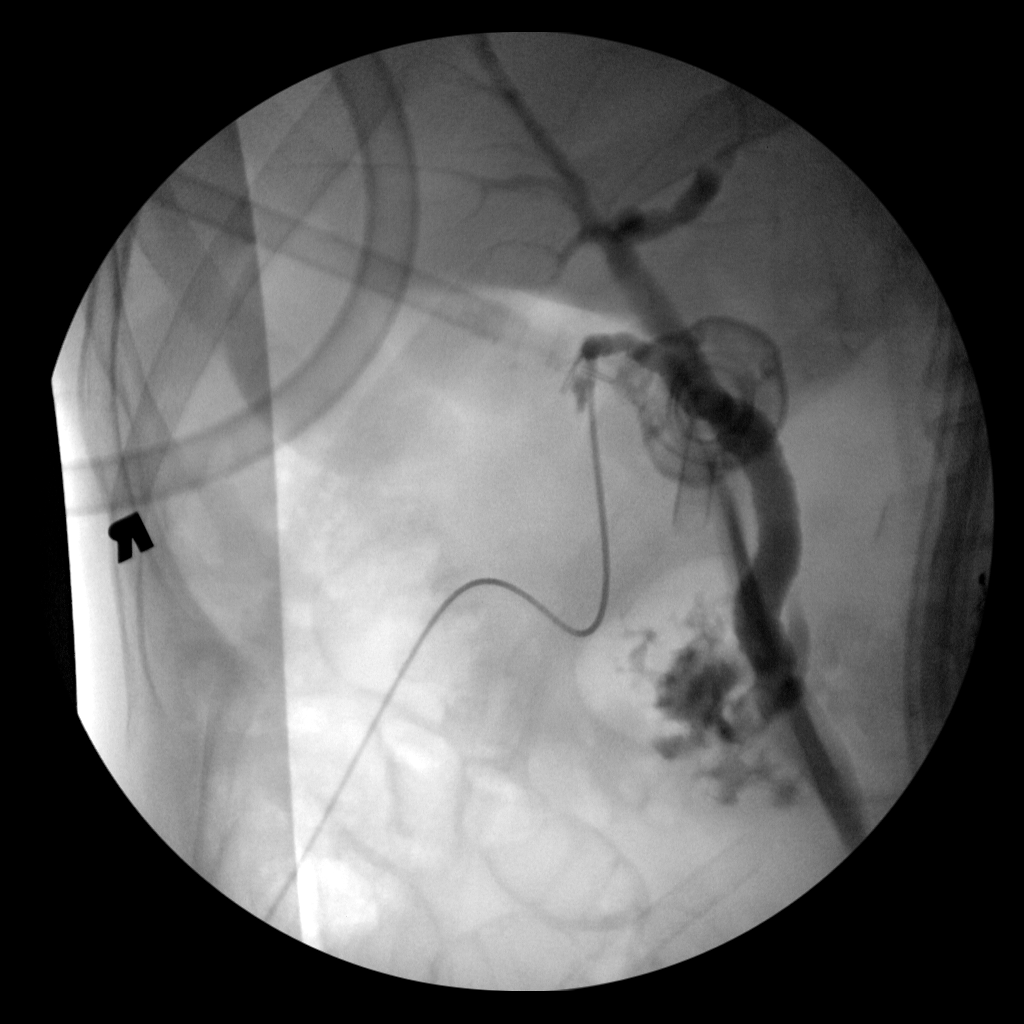
[frame 75/88]
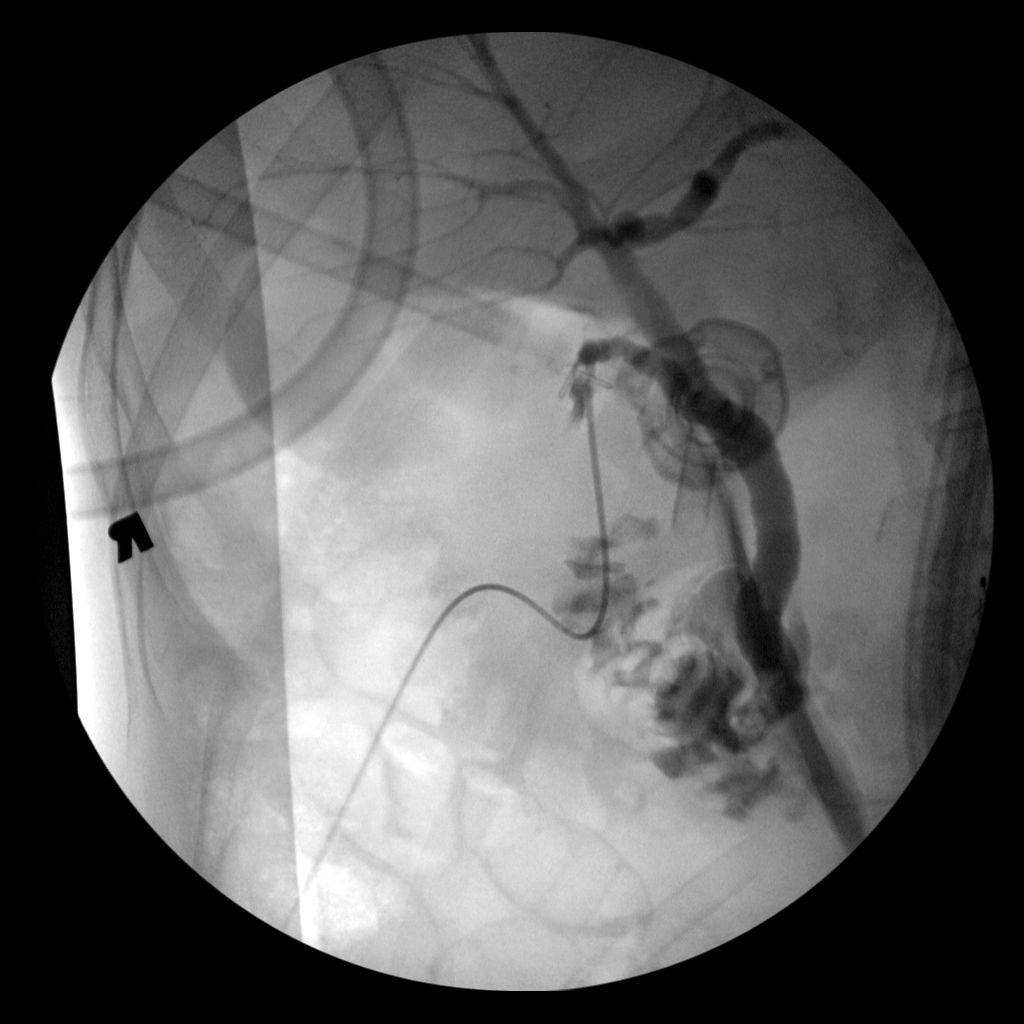

[4 of 4 positions shown; findings below may reference images not displayed]

FINDINGS: Intraoperative cholangiographic images of the right upper abdominal
quadrant during laparoscopic cholecystectomy are provided for
review.

Surgical clips overlie the expected location of the gallbladder
fossa.

Contrast injection demonstrates selective cannulation of the central
aspect of the cystic duct.

There is passage of contrast through the central aspect of the
cystic duct with filling of a mildly dilated common bile duct. There
is passage of contrast though the CBD and into the descending
portion of the duodenum.

There is minimal reflux of injected contrast into the common hepatic
duct and central aspect of the non dilated intrahepatic biliary
system.

There are least 2 ill-defined persistent nonocclusive filling
defects within the distal aspect of the CBD which may represent
gallstones.

There is minimal opacification of the central aspect of the
pancreatic duct which appears mildly dilated.

There is minimal opacification of the duodenal diverticulum that was
demonstrated on preceding abdominal CT.
IMPRESSION: At these 2 persistent ill-defined nonocclusive filling defects
within the distal aspect of the mildly dilated CBD - while
potentially representative of air bubbles, nonocclusive gallstones
could have a similar appearance. Correlation with the operative
report is recommended. Further evaluation with ERCP could be
performed as clinically indicated.

## 2016-11-09 ENCOUNTER — Ambulatory Visit: Payer: Medicare Other | Admitting: Family

## 2016-11-09 ENCOUNTER — Other Ambulatory Visit (HOSPITAL_COMMUNITY): Payer: Medicare Other
